# Patient Record
Sex: Male | Born: 1935 | Race: Black or African American | Hispanic: No | Smoking: Never smoker
Health system: Southern US, Community
[De-identification: ages and names within clinical notes are randomized; demographics above are authoritative.]

## PROBLEM LIST (undated history)

## (undated) DIAGNOSIS — I4892 Unspecified atrial flutter: Secondary | ICD-10-CM

## (undated) DIAGNOSIS — G8929 Other chronic pain: Secondary | ICD-10-CM

## (undated) DIAGNOSIS — M549 Dorsalgia, unspecified: Secondary | ICD-10-CM

## (undated) DIAGNOSIS — M1990 Unspecified osteoarthritis, unspecified site: Secondary | ICD-10-CM

## (undated) DIAGNOSIS — E7800 Pure hypercholesterolemia, unspecified: Secondary | ICD-10-CM

## (undated) DIAGNOSIS — R011 Cardiac murmur, unspecified: Secondary | ICD-10-CM

## (undated) DIAGNOSIS — I10 Essential (primary) hypertension: Secondary | ICD-10-CM

## (undated) HISTORY — PX: COLONOSCOPY: SHX174

---

## 2004-06-10 ENCOUNTER — Encounter: Admission: RE | Admit: 2004-06-10 | Discharge: 2004-06-10 | Payer: Self-pay | Admitting: Nephrology

## 2005-03-07 ENCOUNTER — Encounter (HOSPITAL_COMMUNITY): Admission: RE | Admit: 2005-03-07 | Discharge: 2005-06-05 | Payer: Self-pay | Admitting: Cardiology

## 2005-05-06 ENCOUNTER — Encounter: Admission: RE | Admit: 2005-05-06 | Discharge: 2005-05-06 | Payer: Self-pay | Admitting: Nephrology

## 2005-05-19 ENCOUNTER — Ambulatory Visit (HOSPITAL_COMMUNITY): Admission: RE | Admit: 2005-05-19 | Discharge: 2005-05-19 | Payer: Self-pay | Admitting: Nephrology

## 2005-07-12 ENCOUNTER — Inpatient Hospital Stay (HOSPITAL_BASED_OUTPATIENT_CLINIC_OR_DEPARTMENT_OTHER): Admission: RE | Admit: 2005-07-12 | Discharge: 2005-07-12 | Payer: Self-pay | Admitting: Cardiology

## 2005-10-05 ENCOUNTER — Emergency Department (HOSPITAL_COMMUNITY): Admission: EM | Admit: 2005-10-05 | Discharge: 2005-10-05 | Payer: Self-pay | Admitting: Emergency Medicine

## 2006-09-18 ENCOUNTER — Encounter: Admission: RE | Admit: 2006-09-18 | Discharge: 2006-09-18 | Payer: Self-pay | Admitting: Nephrology

## 2007-02-01 ENCOUNTER — Emergency Department (HOSPITAL_COMMUNITY): Admission: EM | Admit: 2007-02-01 | Discharge: 2007-02-01 | Payer: Self-pay | Admitting: Emergency Medicine

## 2008-09-05 ENCOUNTER — Emergency Department (HOSPITAL_COMMUNITY): Admission: EM | Admit: 2008-09-05 | Discharge: 2008-09-05 | Payer: Self-pay | Admitting: Emergency Medicine

## 2009-07-20 ENCOUNTER — Emergency Department (HOSPITAL_COMMUNITY): Admission: EM | Admit: 2009-07-20 | Discharge: 2009-07-20 | Payer: Self-pay | Admitting: Emergency Medicine

## 2009-10-06 ENCOUNTER — Encounter: Admission: RE | Admit: 2009-10-06 | Discharge: 2009-10-06 | Payer: Self-pay | Admitting: Nephrology

## 2010-04-23 ENCOUNTER — Emergency Department (HOSPITAL_COMMUNITY)
Admission: EM | Admit: 2010-04-23 | Discharge: 2010-04-23 | Disposition: A | Discharge status: Home / Self Care | Payer: Medicare Other | Attending: Emergency Medicine | Admitting: Emergency Medicine

## 2010-04-23 ENCOUNTER — Emergency Department (HOSPITAL_COMMUNITY): Payer: Medicare Other

## 2010-04-23 DIAGNOSIS — R071 Chest pain on breathing: Secondary | ICD-10-CM | POA: Insufficient documentation

## 2010-04-23 DIAGNOSIS — Z79899 Other long term (current) drug therapy: Secondary | ICD-10-CM | POA: Insufficient documentation

## 2010-04-23 DIAGNOSIS — M25519 Pain in unspecified shoulder: Secondary | ICD-10-CM | POA: Insufficient documentation

## 2010-04-23 DIAGNOSIS — I10 Essential (primary) hypertension: Secondary | ICD-10-CM | POA: Insufficient documentation

## 2010-04-23 DIAGNOSIS — Z862 Personal history of diseases of the blood and blood-forming organs and certain disorders involving the immune mechanism: Secondary | ICD-10-CM | POA: Insufficient documentation

## 2010-04-23 DIAGNOSIS — Z8639 Personal history of other endocrine, nutritional and metabolic disease: Secondary | ICD-10-CM | POA: Insufficient documentation

## 2010-07-14 ENCOUNTER — Emergency Department (HOSPITAL_COMMUNITY)
Admission: EM | Admit: 2010-07-14 | Discharge: 2010-07-14 | Disposition: A | Discharge status: Home / Self Care | Payer: Medicare Other | Attending: Emergency Medicine | Admitting: Emergency Medicine

## 2010-07-14 ENCOUNTER — Emergency Department (HOSPITAL_COMMUNITY): Payer: Medicare Other

## 2010-07-14 DIAGNOSIS — M5412 Radiculopathy, cervical region: Secondary | ICD-10-CM | POA: Insufficient documentation

## 2010-07-14 DIAGNOSIS — M109 Gout, unspecified: Secondary | ICD-10-CM | POA: Insufficient documentation

## 2010-07-14 DIAGNOSIS — I10 Essential (primary) hypertension: Secondary | ICD-10-CM | POA: Insufficient documentation

## 2010-07-14 DIAGNOSIS — M79609 Pain in unspecified limb: Secondary | ICD-10-CM | POA: Insufficient documentation

## 2010-07-14 DIAGNOSIS — M542 Cervicalgia: Secondary | ICD-10-CM | POA: Insufficient documentation

## 2010-08-06 NOTE — Cardiovascular Report (Signed)
NAME:  Jeffery White, ROUNDS NO.:  [id number]   MEDICAL RECORD NO.:  [id number]          PATIENT TYPE:  OIB   LOCATION:  1961                         FACILITY:  [facility]   PHYSICIAN:  Mohan N. Sharyn Lull, M.D. DATE OF BIRTH:  Jun 15, 1933   DATE OF PROCEDURE:  07/12/2005  DATE OF DISCHARGE:  07/12/2005                              CARDIAC CATHETERIZATION   PROCEDURE:  Left cardiac cath with selective left and right coronary  angiography, left ventriculography, via right groin using Judkins technique.   INDICATIONS FOR PROCEDURE:  Mr. Kerner is 77 year old black male with past  medical history significant for hypertension, glucose intolerance,  hypercholesteremia, morbid obesity, degenerative joint disease. He complains  of vague chest pain associated with the left arm pain off and on relieved  with sublingual nitro without associated symptoms of nausea, vomiting or  diaphoresis. The patient also gives a history of exertional dyspnea with  minimal exertion associated with feeling weak and tired. EKG done in my  office showed sinus bradycardia with first degree AV block, incomplete left  bundle branch block, and nonspecific ST-T wave changes. The patient denies  any palpitation, light-headedness or syncope. Denies PND, orthopnea, leg  swelling. Denies relation of chest pain to breathing, coughing, movement.  Denies any history of neck trauma in the past. Past medical history as  above. Past surgical history reveals he had laryngeal polyp resection in the  past, had dental surgery in the past, had right shoulder surgery in 1990.   ALLERGIES:  NO KNOWN DRUG ALLERGIES.   MEDICATIONS:  He is on atenolol 25 mg p.o. daily, Norvasc 5 mg p.o. daily,  Crestor 10 mg p.o. daily, Nitrostat 0.4 mg sublingual p.r.n., aspirin 81 mg  p.o. daily, hydrochlorothiazide 25 mg p.o. daily.   SOCIAL HISTORY:  He is married, has six children. No history of smoking.  He  used to drink socially but quit  1 1/2 years ago. Worked at [employer] and also as an Architect in the past.   FAMILY HISTORY:  Negative for coronary artery disease.   PHYSICAL EXAMINATION:  On examination, he is alert and oriented x3 in no  acute distress. Blood pressure was 130/70, pulse was regular at 58, sinus  brady on the monitor.  Conjunctivae was pink. Neck supple, no JVD, no bruit.  Lungs were clear to auscultation without rhonchi or rales. Abdomen was soft,  obese, nontender. Extremities reveals no clubbing, cyanosis or edema.   IMPRESSION:  Recurrent chest pain, left arm pain, rule out coronary  insufficiency, hypertension, glucose intolerance, hypercholesteremia, morbid  obesity, degenerative joint disease.   I discussed with the patient regarding left cath, its risks and benefits  i.e. death, MI, stroke, need for emergency CABG, local vascular  complications, etc., and he consented for the procedure.   PROCEDURE:  After obtaining informed consent, the patient was brought to the  cath lab and was placed on fluoroscopy table. The right groin was prepped  and draped in usual fashion. 2% Xylocaine was used for local anesthesia in  the right groin.  With the help of a  thin-wall needle, a 4-French arterial  sheath was placed. The sheath was aspirated and flushed. Next, 4-French left  Judkins catheter was advanced over the wire under fluoroscopic guidance to  the ascending aorta. The wire was pulled out, the catheter was aspirated and  connected to the manifold. The catheter was further advanced and engaged  into the left coronary ostium. Multiple views of the left system were taken.  Next, the catheter was disengaged and was pulled out over the wire and was  replaced with 4-French right Judkins catheter which was advanced over the  wire under fluoroscopic guidance into the ascending aorta. The wire was  pulled out, the catheter was aspirated and connected to the manifold. The  catheter was  further advanced and engaged into right coronary ostium.  Multiple views of the right system were taken. Next, the catheter was  disengaged and was pulled out over the wire and was replaced with 4-French  pigtail catheter which was advanced over the wire under fluoroscopic  guidance into the ascending aorta. The wire was pulled out, the catheter was  aspirated and connected to the manifold. The catheter was further advanced  across aortic valve into the LV. LV pressures were recorded. Next, LV graft  was done in 30 degrees RAO position. Post angiographic pressures were  recorded from LV and then pullback pressures were recorded from the aorta.  There was no gradient across aortic valve. Next, the pigtail catheter was  pulled out over the wire, the sheaths were aspirated and flushed.   FINDINGS:  LV showed good LV systolic function, EF of 55-60%.   Left main was long which was patent.   LAD has 5-10% proximal and mid stenosis. Diagonal one and two were small  which were patent.   Left circumflex was patent. High OM-1 was long which was small but was  patent. OM-2 was small which was patent.   RCA was patent. PDA was small which was patent.   The patient tolerated procedure well. There are no complications. The  patient was transferred to the recovery room in stable condition.           ______________________________  Eduardo Osier Sharyn Lull, M.D.     [facility]  D:  07/12/2005  T:  07/12/2005  Job:  [id number]   cc:   Jarome Matin, M.D.  Fax: [phone]

## 2011-03-16 ENCOUNTER — Emergency Department (HOSPITAL_COMMUNITY)
Admission: EM | Admit: 2011-03-16 | Discharge: 2011-03-16 | Disposition: A | Discharge status: Home / Self Care | Payer: Medicare Other | Attending: Emergency Medicine | Admitting: Emergency Medicine

## 2011-03-16 ENCOUNTER — Encounter: Payer: Self-pay | Admitting: *Deleted

## 2011-03-16 DIAGNOSIS — M25559 Pain in unspecified hip: Secondary | ICD-10-CM | POA: Insufficient documentation

## 2011-03-16 DIAGNOSIS — I10 Essential (primary) hypertension: Secondary | ICD-10-CM | POA: Insufficient documentation

## 2011-03-16 DIAGNOSIS — E7800 Pure hypercholesterolemia, unspecified: Secondary | ICD-10-CM | POA: Insufficient documentation

## 2011-03-16 DIAGNOSIS — M25552 Pain in left hip: Secondary | ICD-10-CM

## 2011-03-16 DIAGNOSIS — Z79899 Other long term (current) drug therapy: Secondary | ICD-10-CM | POA: Insufficient documentation

## 2011-03-16 HISTORY — DX: Essential (primary) hypertension: I10

## 2011-03-16 HISTORY — DX: Other chronic pain: G89.29

## 2011-03-16 HISTORY — DX: Pure hypercholesterolemia, unspecified: E78.00

## 2011-03-16 HISTORY — DX: Dorsalgia, unspecified: M54.9

## 2011-03-16 MED ORDER — OXYCODONE-ACETAMINOPHEN 5-325 MG PO TABS: 2.0000 | ORAL_TABLET | ORAL | Status: AC | PRN

## 2011-03-16 MED ORDER — HYDROMORPHONE HCL PF 2 MG/ML IJ SOLN
2.0000 mg | Freq: Once | INTRAMUSCULAR | Status: AC
  Administered 2011-03-16: 2 mg via INTRAMUSCULAR
  Filled 2011-03-16: qty 1

## 2011-03-16 NOTE — ED Notes (Signed)
Vital signs stable. 

## 2011-03-16 NOTE — ED Provider Notes (Signed)
History     CSN: [id number]  Arrival date & time 03/16/11  4098   First MD Initiated Contact with Patient 03/16/11 [phone]      Chief Complaint  Patient presents with  . Hip Pain    (Consider location/radiation/quality/duration/timing/severity/associated sxs/prior treatment) Patient is a 77 y.o. male presenting with hip pain. The history is provided by the patient.  Hip Pain Pertinent negatives include no chest pain, no abdominal pain and no headaches.   the patient is a 77 year old, male, who presents to the emergency department with one week of left hip pain.  He says he was ambulating and he twisted and developed sudden pain in his left hip, which has persisted for approximately a week.  He is falling.  He has not been in a car accident.  He is still ambulatory.  He denies weakness in his or extremity.  The pain in his anterior hip.  It does not radiate.  He has not noticed any swelling or masses.  Past Medical History  Diagnosis Date  . Hypertension   . Hypercholesteremia   . Chronic back pain   . Gout     History reviewed. No pertinent past surgical history.  No family history on file.  History  Substance Use Topics  . Smoking status: Never Smoker   . Smokeless tobacco: Not on file  . Alcohol Use: No      Review of Systems  Constitutional: Negative for fever and chills.  Respiratory: Negative for cough.   Cardiovascular: Negative for chest pain.  Gastrointestinal: Negative for abdominal pain.  Genitourinary: Negative for dysuria.  Musculoskeletal: Negative for myalgias, back pain, joint swelling and gait problem.  Skin: Negative for rash.  Neurological: Negative for headaches.  Hematological: Does not bruise/bleed easily.  Psychiatric/Behavioral: Negative for confusion.    Allergies  Review of patient's allergies indicates no known allergies.  Home Medications   Current Outpatient Rx  Name Route Sig Dispense Refill  . AMLODIPINE BESYLATE 5 MG PO TABS  Oral Take 5 mg by mouth daily.      . ASPIRIN 81 MG PO TABS Oral Take 81 mg by mouth daily.      . ATENOLOL 50 MG PO TABS Oral Take 50 mg by mouth daily.      . ETODOLAC 500 MG PO TABS Oral Take 500 mg by mouth 2 (two) times daily as needed.      . FENOFIBRATE 48 MG PO TABS Oral Take 48 mg by mouth daily.      Marland Kitchen HYDROCHLOROTHIAZIDE 25 MG PO TABS Oral Take 25 mg by mouth daily.      Marland Kitchen HYDROCODONE-ACETAMINOPHEN 7.5-325 MG PO TABS Oral Take 1 tablet by mouth every 6 (six) hours as needed. pain     . IBUPROFEN 800 MG PO TABS Oral Take 800 mg by mouth 2 (two) times daily after a meal. pain    . ROSUVASTATIN CALCIUM 10 MG PO TABS Oral Take 5 mg by mouth daily. Pt takes 1/2 tab     . GABAPENTIN 300 MG PO CAPS Oral Take 300 mg by mouth 3 (three) times daily.        BP 142/72  Pulse 77  Temp(Src) 97.8 F (36.6 C) (Oral)  Resp 16  Wt 290 lb (131.543 kg)  SpO2 96%  Physical Exam  Constitutional: He is oriented to person, place, and time.       Morbidly obese  HENT:  Head: Normocephalic and atraumatic.  Eyes: Pupils are equal, round,  and reactive to light.  Neck: Normal range of motion.  Abdominal: He exhibits no distension.  Musculoskeletal: Normal range of motion. He exhibits no edema.       Tenderness over left anterior superior iliac spine area.  No swelling, masses, or discoloration.  Lower extremity is normal with full range of motion and no weakness.  On external rotation of his femur.  He complains of mild increase in pain.  Neurological: He is alert and oriented to person, place, and time.  Skin: Skin is warm and dry.  Psychiatric: He has a normal mood and affect.    ED Course  Procedures (including critical care time) 77 year old male complains of nontraumatic left hip pain for approximately one week.  He is still ambulatory.  There is no deformities of his leg.  He has no weakness.  There are no indications for testing at this time.  Labs Reviewed - No data to display No  results found.   No diagnosis found.    MDM  Left hip pain Patient is still ambulatory.  There is no history of trauma.  There is no indication of a fracture, dislocation, or neurological disorder.        Nicholes Stairs, MD 03/16/11 [phone]

## 2011-03-16 NOTE — ED Notes (Signed)
Pt states "my left hip has been hurting x 1 wk, I think I made a wrong turn and it hurt me a little bit"

## 2011-03-16 NOTE — ED Notes (Signed)
Patient is resting comfortably. 

## 2011-03-16 NOTE — ED Notes (Signed)
MD at bedside. 

## 2011-03-22 ENCOUNTER — Other Ambulatory Visit: Payer: Self-pay | Admitting: Cardiology

## 2011-06-18 ENCOUNTER — Other Ambulatory Visit: Payer: Self-pay | Admitting: Cardiology

## 2011-08-15 ENCOUNTER — Other Ambulatory Visit: Payer: Self-pay | Admitting: Cardiology

## 2011-11-20 ENCOUNTER — Other Ambulatory Visit: Payer: Self-pay | Admitting: Nephrology

## 2015-08-13 ENCOUNTER — Encounter (HOSPITAL_COMMUNITY): Payer: Self-pay

## 2015-08-13 ENCOUNTER — Emergency Department (HOSPITAL_COMMUNITY)
Admission: EM | Admit: 2015-08-13 | Discharge: 2015-08-13 | Disposition: A | Discharge status: Home / Self Care | Payer: Medicare Other | Attending: Emergency Medicine | Admitting: Emergency Medicine

## 2015-08-13 DIAGNOSIS — G8929 Other chronic pain: Secondary | ICD-10-CM | POA: Diagnosis not present

## 2015-08-13 DIAGNOSIS — R269 Unspecified abnormalities of gait and mobility: Secondary | ICD-10-CM | POA: Diagnosis not present

## 2015-08-13 DIAGNOSIS — M79671 Pain in right foot: Secondary | ICD-10-CM | POA: Diagnosis present

## 2015-08-13 DIAGNOSIS — I10 Essential (primary) hypertension: Secondary | ICD-10-CM | POA: Diagnosis not present

## 2015-08-13 DIAGNOSIS — Z7982 Long term (current) use of aspirin: Secondary | ICD-10-CM | POA: Diagnosis not present

## 2015-08-13 DIAGNOSIS — M109 Gout, unspecified: Secondary | ICD-10-CM

## 2015-08-13 DIAGNOSIS — E7800 Pure hypercholesterolemia, unspecified: Secondary | ICD-10-CM | POA: Insufficient documentation

## 2015-08-13 DIAGNOSIS — M10071 Idiopathic gout, right ankle and foot: Secondary | ICD-10-CM | POA: Insufficient documentation

## 2015-08-13 DIAGNOSIS — Z791 Long term (current) use of non-steroidal anti-inflammatories (NSAID): Secondary | ICD-10-CM | POA: Diagnosis not present

## 2015-08-13 DIAGNOSIS — Z79899 Other long term (current) drug therapy: Secondary | ICD-10-CM | POA: Diagnosis not present

## 2015-08-13 MED ORDER — COLCHICINE 0.6 MG PO TABS: 0.6000 mg | ORAL_TABLET | Freq: Every day | ORAL | Status: DC

## 2015-08-13 MED ORDER — OXYCODONE-ACETAMINOPHEN 5-325 MG PO TABS
1.0000 | ORAL_TABLET | Freq: Once | ORAL | Status: AC
  Administered 2015-08-13: 1 via ORAL
  Filled 2015-08-13: qty 1

## 2015-08-13 MED ORDER — OXYCODONE-ACETAMINOPHEN 5-325 MG PO TABS: 1.0000 | ORAL_TABLET | Freq: Four times a day (QID) | ORAL | Status: DC | PRN

## 2015-08-13 NOTE — ED Notes (Signed)
Pt presents with 2 day h/o L foot and ankle redness and pain.  Pt denies any injury.

## 2015-08-13 NOTE — Discharge Instructions (Signed)

## 2015-08-13 NOTE — ED Provider Notes (Signed)
CSN: [id number]     Arrival date & time 08/13/15  [id number] History   First MD Initiated Contact with Patient 08/13/15 [phone]     Chief Complaint  Patient presents with  . Foot Pain      Patient is a 82 y.o. male presenting with lower extremity pain. The history is provided by the patient.  Foot Pain This is a new problem. Pertinent negatives include no chest pain and no shortness of breath.  Patient presents with pain in his right foot. Began a day or 2 ago. Some redness and swelling. States the pain and swelling has decreased somewhat. No systemic fevers or chills. No trauma. No chest pain or trouble breathing. He has not had pains like this before. No reported history of gout. No change in his urination pattern. No recent changes medications.  Past Medical History  Diagnosis Date  . Hypertension   . Hypercholesteremia   . Chronic back pain   . Gout    History reviewed. No pertinent past surgical history. No family history on file. Social History  Substance Use Topics  . Smoking status: Never Smoker   . Smokeless tobacco: None  . Alcohol Use: No    Review of Systems  Constitutional: Negative for fever.  Respiratory: Negative for shortness of breath.   Cardiovascular: Negative for chest pain.  Musculoskeletal: Positive for joint swelling and gait problem.  Skin: Positive for color change.  Neurological: Negative for weakness.      Allergies  Review of patient's allergies indicates no known allergies.  Home Medications   Prior to Admission medications   Medication Sig Start Date End Date Taking? Authorizing Provider  amLODipine (NORVASC) 5 MG tablet Take 5 mg by mouth daily.      Historical Provider, MD  aspirin 81 MG tablet Take 81 mg by mouth daily.      Historical Provider, MD  atenolol (TENORMIN) 50 MG tablet Take 50 mg by mouth daily.      Historical Provider, MD  colchicine 0.6 MG tablet Take 1 tablet (0.6 mg total) by mouth daily. 1.2 mg first day then 0.6 mg daily.  08/13/15   Benjiman CoreNathan Leatta Alewine, MD  etodolac (LODINE) 500 MG tablet Take 500 mg by mouth 2 (two) times daily as needed.      Historical Provider, MD  fenofibrate (TRICOR) 48 MG tablet Take 48 mg by mouth daily.      Historical Provider, MD  gabapentin (NEURONTIN) 300 MG capsule Take 300 mg by mouth 3 (three) times daily.      Historical Provider, MD  hydrochlorothiazide (HYDRODIURIL) 25 MG tablet Take 25 mg by mouth daily.      Historical Provider, MD  HYDROcodone-acetaminophen (NORCO) 7.5-325 MG per tablet Take 1 tablet by mouth every 6 (six) hours as needed. pain     Historical Provider, MD  ibuprofen (ADVIL,MOTRIN) 800 MG tablet Take 800 mg by mouth 2 (two) times daily after a meal. pain    Historical Provider, MD  oxyCODONE-acetaminophen (PERCOCET/ROXICET) 5-325 MG tablet Take 1-2 tablets by mouth every 6 (six) hours as needed for severe pain. 08/13/15   Benjiman CoreNathan Artia Singley, MD  rosuvastatin (CRESTOR) 10 MG tablet Take 5 mg by mouth daily. Pt takes 1/2 tab     Historical Provider, MD   BP 151/98 mmHg  Pulse 91  Temp(Src) 98.5 F (36.9 C) (Oral)  Resp 18  Ht 6' (1.829 m)  Wt 272 lb (123.378 kg)  BMI 36.88 kg/m2  SpO2 95% Physical Exam  Constitutional: He appears well-developed.  Musculoskeletal: He exhibits tenderness.  Erythema and tenderness over first MTP joint on the right foot. Neurovascular intact otherwise. Dorsalis pedis pulse intact. No tenderness or pain at ankle.  Neurological: He is alert.  Skin: Skin is warm. There is erythema.    ED Course  Procedures (including critical care time) Labs Review Labs Reviewed - No data to display  Imaging Review No results found. I have personally reviewed and evaluated these images and lab results as part of my medical decision-making.   EKG Interpretation None      MDM   Final diagnoses:  Acute gout of right foot, unspecified cause    Patient with likely gout in right first MTP joint. Will treat with colchicine and some pain  medicine. Will follow-up with primary care doctor.    Benjiman Core, MD 08/13/15 [phone]

## 2016-03-13 ENCOUNTER — Other Ambulatory Visit (INDEPENDENT_AMBULATORY_CARE_PROVIDER_SITE_OTHER): Payer: Self-pay | Admitting: Specialist

## 2016-03-17 NOTE — Telephone Encounter (Signed)
[facility] calling about RX refill they sent E script. They faxed it also to the new fax number incase we haven't received it

## 2016-03-23 ENCOUNTER — Ambulatory Visit (INDEPENDENT_AMBULATORY_CARE_PROVIDER_SITE_OTHER): Payer: Self-pay | Admitting: Specialist

## 2016-07-04 ENCOUNTER — Ambulatory Visit (INDEPENDENT_AMBULATORY_CARE_PROVIDER_SITE_OTHER): Payer: Medicare Other | Admitting: Specialist

## 2016-07-04 ENCOUNTER — Ambulatory Visit (INDEPENDENT_AMBULATORY_CARE_PROVIDER_SITE_OTHER): Payer: Medicare Other

## 2016-07-04 ENCOUNTER — Encounter (INDEPENDENT_AMBULATORY_CARE_PROVIDER_SITE_OTHER): Payer: Self-pay | Admitting: Specialist

## 2016-07-04 VITALS — BP 145/89 | HR 78 | Ht 72.0 in | Wt 267.0 lb

## 2016-07-04 DIAGNOSIS — M25551 Pain in right hip: Secondary | ICD-10-CM | POA: Diagnosis not present

## 2016-07-04 DIAGNOSIS — M160 Bilateral primary osteoarthritis of hip: Secondary | ICD-10-CM | POA: Diagnosis not present

## 2016-07-04 MED ORDER — MELOXICAM 15 MG PO TABS: 15.0000 mg | ORAL_TABLET | Freq: Every day | ORAL | 6 refills | Status: DC

## 2016-07-04 MED ORDER — OXYCODONE-ACETAMINOPHEN 5-325 MG PO TABS
1.0000 | ORAL_TABLET | ORAL | 0 refills | Status: DC | PRN
Start: 2016-07-04 — End: 2016-08-16

## 2016-07-04 NOTE — Patient Instructions (Addendum)
  Hips are suffering from osteoarthritis, only real proven treatments are Weight loss, NSIADs like diclofenac and exercise. Well padded shoes help. Ice the hips and knees 2-3 times a day 15-20 mins at a time. Referral to pain management with Dr. Hermelinda Medicus with [facility] For continued medication. Change to Meloxicam 15 mg po q Day. Stop the Ibuprofen.

## 2016-07-04 NOTE — Progress Notes (Signed)
Office Visit Note   Patient: Jeffery White           Date of Birth: Jan 16, 1934           MRN: [id number] Visit Date: 07/04/2016              Requested by: No referring provider defined for this encounter. PCP: Georgann Housekeeper, MD   Assessment & Plan: Visit Diagnoses:  1. Pain of right hip joint   2. Primary osteoarthritis of both hips     Plan: Hips are suffering from osteoarthritis, only real proven treatments are Weight loss, NSIADs like diclofenac and exercise. Well padded shoes help. Ice the hips and knees 2-3 times a day 15-20 mins at a time. Referral to pain management with Dr. Hermelinda Medicus with [facility] for continued medication. Change to Meloxicam 15 mg po q Day. Stop the Ibuprofen  Follow-Up Instructions: Return in about 3 months (around 10/03/2016).   Orders:  Orders Placed This Encounter  Procedures  . XR HIP UNILAT W OR W/O PELVIS 2-3 VIEWS RIGHT   No orders of the defined types were placed in this encounter.     Procedures: No procedures performed   Clinical Data: No additional findings.   Subjective: Chief Complaint  Patient presents with  . Right Hip - Pain    83 year old male, has been followed for osteoarthritis of the spine and hips. Has taken cholcicine for gout in the feet and ibuprofen. He is taking citric phosphate, avoiding red meats. Has arthritis in most of his large joints. Has lost weight 40-50 lbs Is using a cane now for the last 2 weeks. Still fishing porgy and sea bass.    Review of Systems  Constitutional: Negative.   HENT: Negative.   Eyes: Negative.   Respiratory: Negative.   Cardiovascular: Negative.   Gastrointestinal: Negative.   Endocrine: Negative.   Genitourinary: Negative.   Musculoskeletal: Negative.   Skin: Negative.   Allergic/Immunologic: Negative.   Neurological: Negative.   Hematological: Negative.   Psychiatric/Behavioral: Negative.      Objective: Vital Signs: BP (!) 145/89 (BP  Location: Left Arm, Patient Position: Sitting)   Pulse 78   Ht 6' (1.829 m)   Wt 267 lb (121.1 kg)   BMI 36.21 kg/m   Physical Exam  Constitutional: He is oriented to person, place, and time. He appears well-developed and well-nourished.  HENT:  Head: Normocephalic and atraumatic.  Eyes: EOM are normal. Pupils are equal, round, and reactive to light.  Neck: Normal range of motion. Neck supple.  Pulmonary/Chest: Effort normal and breath sounds normal.  Abdominal: Soft. Bowel sounds are normal.  Neurological: He is alert and oriented to person, place, and time.  Skin: Skin is warm and dry.  Psychiatric: He has a normal mood and affect. His behavior is normal. Judgment and thought content normal.    Right Hip Exam   Tenderness  The patient is experiencing tenderness in the greater trochanter and anterior.  Range of Motion  Extension: abnormal  Flexion: abnormal  Internal Rotation:  0 abnormal  External Rotation:  20 abnormal  Abduction: abnormal  Adduction: abnormal   Muscle Strength  Abduction: 5/5  Adduction: 5/5  Flexion: 5/5   Tests  FABER: positive Ober: positive  Other  Erythema: absent Scars: absent   Left Hip Exam   Tenderness  The patient is experiencing tenderness in the anterior and greater trochanter.  Range of Motion  Extension: abnormal  Flexion: abnormal  Internal Rotation: 25 abnormal  External Rotation:  30 abnormal  Abduction: abnormal  Adduction: abnormal   Muscle Strength  Abduction: 5/5  Adduction: 5/5  Flexion: 5/5   Tests  FABER: negative Ober: negative  Other  Erythema: absent Scars: absent   Back Exam   Tenderness  The patient is experiencing tenderness in the lumbar and cervical.  Range of Motion  Extension: normal  Flexion: normal  Lateral Bend Right: normal  Lateral Bend Left: normal  Rotation Right: normal  Rotation Left: normal   Muscle Strength  Right Quadriceps:  5/5  Left Quadriceps:  5/5  Right  Hamstrings:  5/5  Left Hamstrings:  5/5   Tests  Straight leg raise right: negative Straight leg raise left: negative  Reflexes  Patellar: normal Achilles: normal Biceps: normal Babinski's sign: normal   Other  Toe Walk: normal Heel Walk: normal Sensation: normal Gait: normal  Scars: absent      Specialty Comments:  No specialty comments available.  Imaging: Xr Hip Unilat W Or W/o Pelvis 2-3 Views Right  Result Date: 07/04/2016 AP and lateral of the right hip shows narrowing of the right hip joint concentricly wih bone on bone appearance over the superiolateral Aspect of the right acetabulum. Osteophytes are present inferomdial and over the lateral head and neck.     PMFS History: There are no active problems to display for this patient.  Past Medical History:  Diagnosis Date  . Chronic back pain   . Gout   . Hypercholesteremia   . Hypertension     No family history on file.  No past surgical history on file. Social History   Occupational History  . Not on file.   Social History Main Topics  . Smoking status: Never Smoker  . Smokeless tobacco: Never Used  . Alcohol use No  . Drug use: No  . Sexual activity: Not on file

## 2016-08-12 ENCOUNTER — Ambulatory Visit (INDEPENDENT_AMBULATORY_CARE_PROVIDER_SITE_OTHER): Payer: Medicare Other | Admitting: Specialist

## 2016-08-16 ENCOUNTER — Ambulatory Visit (INDEPENDENT_AMBULATORY_CARE_PROVIDER_SITE_OTHER): Payer: Medicare Other | Admitting: Specialist

## 2016-08-16 ENCOUNTER — Encounter (INDEPENDENT_AMBULATORY_CARE_PROVIDER_SITE_OTHER): Payer: Self-pay | Admitting: Specialist

## 2016-08-16 VITALS — BP 148/95 | HR 69 | Ht 72.0 in | Wt 267.0 lb

## 2016-08-16 DIAGNOSIS — M47812 Spondylosis without myelopathy or radiculopathy, cervical region: Secondary | ICD-10-CM

## 2016-08-16 DIAGNOSIS — M25551 Pain in right hip: Secondary | ICD-10-CM

## 2016-08-16 DIAGNOSIS — M160 Bilateral primary osteoarthritis of hip: Secondary | ICD-10-CM

## 2016-08-16 DIAGNOSIS — M170 Bilateral primary osteoarthritis of knee: Secondary | ICD-10-CM

## 2016-08-16 MED ORDER — MELOXICAM 15 MG PO TABS: 15.0000 mg | ORAL_TABLET | Freq: Every day | ORAL | 6 refills | Status: DC

## 2016-08-16 MED ORDER — OXYCODONE-ACETAMINOPHEN 5-325 MG PO TABS: 1.0000 | ORAL_TABLET | ORAL | 0 refills | Status: DC | PRN

## 2016-08-16 NOTE — Patient Instructions (Signed)
The main ways of treat osteoarthritis, that are found to be success. Weight loss helps to decrease pain. Exercise is important to maintaining cartilage and thickness and strengthening. NSAIDs like motrin, tylenol, alleve are meds decreasing the inflamation. Ice is okay  In afternoon and evening and hot shower in the am Dr. Avonia BlasNewton's secretary will call you to schedule an injection of the right hip.

## 2016-08-16 NOTE — Progress Notes (Signed)
Office Visit Note   Patient: Jeffery White           Date of Birth: Jun 01, 1933           MRN: [id number] Visit Date: 08/16/2016              Requested by: Georgann Housekeeper, MD [address] [place], [place] [address] PCP: Georgann Housekeeper, MD   Assessment & Plan: Visit Diagnoses:  1. Pain of right hip joint   2. Spondylosis without myelopathy or radiculopathy, cervical region   3. Bilateral primary osteoarthritis of knee   4. Primary osteoarthritis of both hips     Plan:  The main ways of treat osteoarthritis, that are found to be success. Weight loss helps to decrease pain. Exercise is important to maintaining cartilage and thickness and strengthening. NSAIDs like motrin, tylenol, alleve are meds decreasing the inflamation. Ice is okay  In afternoon and evening and hot shower in the am Dr. [place] Blas secretary will call you to schedule an injection of the right hip. Follow-Up Instructions: Return in about 6 months (around 02/16/2017).   Orders:  Orders Placed This Encounter  Procedures  . Ambulatory referral to Physical Medicine Rehab   Meds ordered this encounter  Medications  . oxyCODONE-acetaminophen (PERCOCET/ROXICET) 5-325 MG tablet    Sig: Take 1-2 tablets by mouth every 4 (four) hours as needed for severe pain.    Dispense:  50 tablet    Refill:  0  . meloxicam (MOBIC) 15 MG tablet    Sig: Take 1 tablet (15 mg total) by mouth daily.    Dispense:  30 tablet    Refill:  6      Procedures: No procedures performed   Clinical Data: No additional findings.   Subjective: Chief Complaint  Patient presents with  . Right Hip - Follow-up, Pain    83 year old male with right hip pain persisting, pain is nearly constant. Present when he first stands and walk. Limbers up after a few steps. He has had previous intraarticular hip steroid  Injection with good improvement in hip pain. That injection was nearly one year ago 06/2015. He is sleeping without  difficulty. He uses a cane for ambulation. Takes mobic for pain daily. He also takes hydrocodone intermittantly when he is severly stiff and the mobic.    Review of Systems  Constitutional: Negative.   HENT: Positive for rhinorrhea.   Eyes: Negative.   Respiratory: Positive for cough.   Cardiovascular: Negative.   Gastrointestinal: Negative.   Endocrine: Negative.   Genitourinary: Negative.   Musculoskeletal: Positive for arthralgias, back pain and joint swelling. Negative for neck stiffness.  Skin: Negative.   Allergic/Immunologic: Negative.   Neurological: Positive for numbness.  Hematological: Negative.   Psychiatric/Behavioral: Negative.      Objective: Vital Signs: BP (!) 148/95 (BP Location: Left Arm, Patient Position: Sitting)   Pulse 69   Ht 6' (1.829 m)   Wt 267 lb (121.1 kg)   BMI 36.21 kg/m   Physical Exam  Constitutional: He is oriented to person, place, and time. He appears well-developed and well-nourished.  HENT:  Head: Normocephalic and atraumatic.  Eyes: EOM are normal. Pupils are equal, round, and reactive to light.  Neck: Normal range of motion. Neck supple.  Pulmonary/Chest: Effort normal and breath sounds normal.  Abdominal: Soft. Bowel sounds are normal.  Neurological: He is alert and oriented to person, place, and time.  Skin: Skin is warm and dry.  Psychiatric: He has a normal mood and affect. His behavior is normal. Judgment and thought content normal.    Right Hip Exam   Tenderness  The patient is experiencing tenderness in the greater trochanter and anterior.  Range of Motion  Extension: normal  Flexion: normal  Internal Rotation:  0 abnormal  External Rotation:  30 abnormal  Abduction: abnormal  Adduction: abnormal   Muscle Strength  Abduction: 5/5  Adduction: 5/5  Flexion: 5/5   Tests  FABER: positive Ober: positive  Other  Erythema: absent Scars: absent Sensation: normal Pulse: present   Left Hip Exam  Left hip  exam is normal.   Back Exam   Tenderness  The patient is experiencing tenderness in the lumbar.  Range of Motion  Extension: normal  Flexion: normal  Lateral Bend Left: normal  Rotation Left: normal   Tests  Straight leg raise right: negative Straight leg raise left: negative  Reflexes  Patellar:  Hyporeflexic abnormal Achilles: Hyporeflexic Biceps: Hyporeflexic Babinski's sign: normal   Other  Toe Walk: normal Heel Walk: normal Sensation: normal Gait: Trendelenburg  Erythema: no back redness Scars: absent      Specialty Comments:  No specialty comments available.  Imaging: No results found.   PMFS History: There are no active problems to display for this patient.  Past Medical History:  Diagnosis Date  . Chronic back pain   . Gout   . Hypercholesteremia   . Hypertension     No family history on file.  No past surgical history on file. Social History   Occupational History  . Not on file.   Social History Main Topics  . Smoking status: Never Smoker  . Smokeless tobacco: Never Used  . Alcohol use No  . Drug use: No  . Sexual activity: Not on file

## 2016-08-24 ENCOUNTER — Ambulatory Visit (INDEPENDENT_AMBULATORY_CARE_PROVIDER_SITE_OTHER): Payer: Medicare Other | Admitting: Physical Medicine and Rehabilitation

## 2016-08-24 ENCOUNTER — Encounter (INDEPENDENT_AMBULATORY_CARE_PROVIDER_SITE_OTHER): Payer: Self-pay | Admitting: Physical Medicine and Rehabilitation

## 2016-08-24 ENCOUNTER — Ambulatory Visit (INDEPENDENT_AMBULATORY_CARE_PROVIDER_SITE_OTHER): Payer: Medicare Other

## 2016-08-24 VITALS — BP 150/93 | HR 70 | Temp 98.4°F

## 2016-08-24 DIAGNOSIS — M25551 Pain in right hip: Secondary | ICD-10-CM | POA: Diagnosis not present

## 2016-08-24 MED ORDER — BUPIVACAINE HCL 0.5 % IJ SOLN
3.0000 mL | INTRAMUSCULAR | Status: AC | PRN
  Administered 2016-08-24: 3 mL via INTRA_ARTICULAR

## 2016-08-24 MED ORDER — TRIAMCINOLONE ACETONIDE 40 MG/ML IJ SUSP
80.0000 mg | INTRAMUSCULAR | Status: AC | PRN
  Administered 2016-08-24: 80 mg via INTRA_ARTICULAR

## 2016-08-24 NOTE — Progress Notes (Signed)
   Jeffery White - 83 y.o. male MRN [id number]  Date of birth: 08-13-1933  Office Visit Note: Visit Date: 08/24/2016 PCP: Georgann HousekeeperHusain, Karrar, MD Referred by: Georgann HousekeeperHusain, Karrar, MD  Subjective: Chief Complaint  Patient presents with  . Right Hip - Pain   HPI: Jeffery White is an 83 year old gentleman with significant right hip osteoarthritis. Prior hip injection performed in our office gave him almost 1 years relief. Dr. Otelia SergeantNitka requests repeat right hip injection with fluoroscopic guidance. He is having return of right hip and groin pain. Worse in the morning more medication and activity. Worse with ambulating. He does use a cane.    ROS Otherwise per HPI.  Assessment & Plan: Visit Diagnoses:  1. Pain in right hip     Plan: Findings:  Right hip intra-articular injection with fluoroscopic guidance. Patient did have relief during the anesthetic phase.    Meds & Orders: No orders of the defined types were placed in this encounter.   Orders Placed This Encounter  Procedures  . Large Joint Injection/Arthrocentesis  . XR C-ARM NO REPORT    Follow-up: Return if symptoms worsen or fail to improve, for Dr. Otelia SergeantNitka.   Procedures: Large Joint Inj Date/Time: 08/24/2016 9:57 AM Performed by: Tyrell AntonioNEWTON, Krisanne Lich Authorized by: Tyrell AntonioNEWTON, Kazue Cerro   Consent Given by:  Patient Site marked: the procedure site was marked   Timeout: prior to procedure the correct patient, procedure, and site was verified   Indications:  Pain and diagnostic evaluation Location:  Hip Site:  R hip joint Prep: patient was prepped and draped in usual sterile fashion   Needle Size:  22 G Needle Length:  3.5 inches Approach:  Anterior Ultrasound Guidance: No   Fluoroscopic Guidance: Yes   Arthrogram: No   Medications:  80 mg triamcinolone acetonide 40 MG/ML; 3 mL bupivacaine 0.5 % Aspiration Attempted: Yes   Patient tolerance:  Patient tolerated the procedure well with no immediate complications  There was excellent flow of  contrast producing a partial arthrogram of the hip. The patient did have relief of symptoms during the anesthetic phase of the injection.    No notes on file   Clinical History: No specialty comments available.  He reports that he has never smoked. He has never used smokeless tobacco. No results for input(s): HGBA1C, LABURIC in the last 8760 hours.  Objective:  VS:  HT:    WT:   BMI:     BP:(!) 150/93  HR:70bpm  TEMP:98.4 F (36.9 C)( )  RESP:99 % Physical Exam  Musculoskeletal:  Patient has concordant pain with internal rotation. He ambulates with a cane. He has good distal strength.    Ortho Exam Imaging: Xr C-arm No Report  Result Date: 08/24/2016 Please see Notes or Procedures tab for imaging impression.   Past Medical/Family/Surgical/Social History: Medications & Allergies reviewed per EMR There are no active problems to display for this patient.  Past Medical History:  Diagnosis Date  . Chronic back pain   . Gout   . Hypercholesteremia   . Hypertension    No family history on file. No past surgical history on file. Social History   Occupational History  . Not on file.   Social History Main Topics  . Smoking status: Never Smoker  . Smokeless tobacco: Never Used  . Alcohol use No  . Drug use: No  . Sexual activity: Not on file

## 2016-08-24 NOTE — Patient Instructions (Signed)

## 2016-08-24 NOTE — Progress Notes (Deleted)
Right hip pain with radiating pain to groin. Pain is worse in the morning until after moving and taking medication.  Patient uses cane for walking. Has had previous injection before about 1 year with relief. Any increased activity such as walking, bending, or lifting will increase pain.

## 2017-02-16 ENCOUNTER — Encounter (INDEPENDENT_AMBULATORY_CARE_PROVIDER_SITE_OTHER): Payer: Self-pay | Admitting: Specialist

## 2017-02-16 ENCOUNTER — Ambulatory Visit (INDEPENDENT_AMBULATORY_CARE_PROVIDER_SITE_OTHER): Payer: Medicare Other | Admitting: Specialist

## 2017-02-16 VITALS — BP 137/83 | HR 74 | Ht 72.0 in | Wt 267.0 lb

## 2017-02-16 DIAGNOSIS — M25551 Pain in right hip: Secondary | ICD-10-CM | POA: Diagnosis not present

## 2017-02-16 DIAGNOSIS — M1611 Unilateral primary osteoarthritis, right hip: Secondary | ICD-10-CM | POA: Diagnosis not present

## 2017-02-16 DIAGNOSIS — M160 Bilateral primary osteoarthritis of hip: Secondary | ICD-10-CM

## 2017-02-16 DIAGNOSIS — M47816 Spondylosis without myelopathy or radiculopathy, lumbar region: Secondary | ICD-10-CM | POA: Diagnosis not present

## 2017-02-16 DIAGNOSIS — M47812 Spondylosis without myelopathy or radiculopathy, cervical region: Secondary | ICD-10-CM | POA: Diagnosis not present

## 2017-02-16 MED ORDER — OXYCODONE-ACETAMINOPHEN 5-325 MG PO TABS: 1.0000 | ORAL_TABLET | ORAL | 0 refills | Status: DC | PRN

## 2017-02-16 NOTE — Patient Instructions (Addendum)
  Knee is suffering from osteoarthritis, only real proven treatments are Weight loss, NSIADs like motrin and exercise. Well padded shoes help. Ice the hip 2-3 times a day 15-20 mins at a time. Schedule an appointment to see Dr. Allie Bossierhris Blackman to consider a right total hip replacement.

## 2017-02-16 NOTE — Progress Notes (Signed)
Office Visit Note   Patient: Jeffery White           Date of Birth: September 06, 1933           MRN: [id number] Visit Date: 02/16/2017              Requested by: Georgann HousekeeperHusain, Karrar, MD [address] [place], [place] [address] PCP: Georgann HousekeeperHusain, Karrar, MD   Assessment & Plan: Visit Diagnoses:  1. Unilateral primary osteoarthritis, right hip   2. Spondylosis without myelopathy or radiculopathy, cervical region   3. Spondylosis without myelopathy or radiculopathy, lumbar region     Plan: Knee is suffering from osteoarthritis, only real proven treatments are Weight loss, NSIADs like motrin and exercise. Well padded shoes help. Ice the hip 2-3 times a day 15-20 mins at a time. Schedule an appointment to see Dr. Allie Bossierhris Blackman to consider a right total hip replacement.   Follow-Up Instructions: Return for Please schedule this patient for appt with Dr. Magnus IvanBlackman in 2 weeks for eval for hip replacement..   Orders:  No orders of the defined types were placed in this encounter.  No orders of the defined types were placed in this encounter.     Procedures: No procedures performed   Clinical Data: Findings:  Plain radiographs 06/2016 with moderately severe degenerative changes of the right hip with medial and superior joint line narrowing. Superolateral right acetabulum osteophytes with osteophytes both superolateral and inferomedial femoral head. Subchondral sclerosis right acetabulum.    Subjective: Chief Complaint  Patient presents with  . Right Hip - Pain    83 year old male, extremely independent, fishing on most weekends, he has been experiencing right anterior groin pain with radiation into the right anterior thigh. He reports that over the last  6 months he is having to stop fishing and hunting due to decrease walking tolerance. He has pain with initiating walking and weight bearing on the right leg at the hip. He take narcotics to relieve His arthritis pain as the NSAIDs  do not really help. He is limited in his standing and walking to 10 minutes anid ambulation is with assistance, he is using a cane. No numbness or paresthesias on the right side. He does occasionally have left arm and hand nocturnal paresthesias that improve with changing position of the left arm. He wants to decrease his pain and improve his ability to be  Active again, he wants to fish and hunt. No bowel or bladder difficulties.     Review of Systems  Constitutional: Negative.   HENT: Positive for congestion, hearing loss, rhinorrhea and sinus pain. Negative for sinus pressure, tinnitus, trouble swallowing and voice change.   Eyes: Negative for pain, discharge, redness, itching and visual disturbance.  Respiratory: Positive for cough. Negative for apnea, choking, chest tightness, shortness of breath, wheezing and stridor.   Cardiovascular: Positive for leg swelling. Negative for chest pain and palpitations.  Gastrointestinal: Negative.  Negative for abdominal distention, abdominal pain, anal bleeding, blood in stool, constipation, diarrhea and nausea.  Endocrine: Negative.  Negative for cold intolerance, heat intolerance, polydipsia, polyphagia and polyuria.  Genitourinary: Positive for frequency. Negative for difficulty urinating, dysuria, enuresis, flank pain and hematuria.  Musculoskeletal: Positive for arthralgias, back pain, gait problem and joint swelling. Negative for myalgias, neck pain and neck stiffness.  Skin: Negative for color change, pallor, rash and wound.  Allergic/Immunologic: Negative.  Negative for environmental allergies, food allergies and immunocompromised state.  Neurological: Negative for dizziness, tremors, seizures,  syncope, facial asymmetry, speech difficulty, weakness, light-headedness, numbness and headaches.  Hematological: Negative.  Negative for adenopathy. Does not bruise/bleed easily.  Psychiatric/Behavioral: Negative.  Negative for agitation, behavioral  problems, confusion, decreased concentration, dysphoric mood, hallucinations, self-injury, sleep disturbance and suicidal ideas. The patient is not nervous/anxious and is not hyperactive.      Objective: Vital Signs: BP 137/83 (BP Location: Left Arm, Patient Position: Sitting)   Pulse 74   Ht 6' (1.829 m)   Wt 267 lb (121.1 kg)   BMI 36.21 kg/m   Physical Exam  Constitutional: He is oriented to person, place, and time. He appears well-developed and well-nourished. No distress.  HENT:  Head: Normocephalic and atraumatic.  Eyes: EOM are normal. Pupils are equal, round, and reactive to light. Right eye exhibits no discharge. Left eye exhibits no discharge.  Neck: Normal range of motion. Neck supple.  Pulmonary/Chest: Effort normal and breath sounds normal. No respiratory distress. He has no wheezes. He exhibits no tenderness.  Abdominal: Soft. Bowel sounds are normal. He exhibits no distension and no mass. There is no tenderness. There is no guarding.  Musculoskeletal: He exhibits no edema, tenderness or deformity.  Neurological: He is alert and oriented to person, place, and time.  Skin: Skin is warm and dry. He is not diaphoretic.  Psychiatric: He has a normal mood and affect. His behavior is normal. Judgment and thought content normal.    Right Hip Exam   Tenderness  The patient is experiencing tenderness in the anterior and greater trochanter.  Range of Motion  Abduction: abnormal  Adduction: abnormal  Extension: abnormal  Flexion:  90 abnormal  External rotation: 20  Internal rotation: 15   Muscle Strength  Abduction: 5/5  Adduction: 5/5  Flexion: 4/5   Tests  FABER: positive Ober: positive  Other  Erythema: absent Scars: absent Sensation: normal Pulse: present   Left Hip Exam  Left hip exam is normal.  Range of Motion  Abduction: normal  Adduction: normal  Extension: normal  Flexion: normal  External rotation: normal  Internal rotation: normal    Muscle Strength  Abduction: 5/5  Adduction: 5/5  Flexion: 5/5   Tests  FABER: negative Ober: negative  Other  Erythema: absent Scars: absent Sensation: normal Pulse: present      Specialty Comments:  No specialty comments available.  Imaging: No results found.   PMFS History: There are no active problems to display for this patient.  Past Medical History:  Diagnosis Date  . Chronic back pain   . Gout   . Hypercholesteremia   . Hypertension     History reviewed. No pertinent family history.  History reviewed. No pertinent surgical history. Social History   Occupational History  . Not on file  Tobacco Use  . Smoking status: Never Smoker  . Smokeless tobacco: Never Used  Substance and Sexual Activity  . Alcohol use: No  . Drug use: No  . Sexual activity: Not on file

## 2017-03-06 ENCOUNTER — Encounter (INDEPENDENT_AMBULATORY_CARE_PROVIDER_SITE_OTHER): Payer: Self-pay | Admitting: Orthopaedic Surgery

## 2017-03-06 ENCOUNTER — Ambulatory Visit (INDEPENDENT_AMBULATORY_CARE_PROVIDER_SITE_OTHER): Payer: Medicare Other | Admitting: Orthopaedic Surgery

## 2017-03-06 DIAGNOSIS — M1611 Unilateral primary osteoarthritis, right hip: Secondary | ICD-10-CM

## 2017-03-06 NOTE — Progress Notes (Signed)
Office Visit Note   Patient: Jeffery White           Date of Birth: Aug 08, 1933           MRN: [id number] Visit Date: 03/06/2017              Requested by: Georgann HousekeeperHusain, Karrar, MD [address] [place], [place] [address] PCP: Georgann HousekeeperHusain, Karrar, MD   Assessment & Plan: Visit Diagnoses:  1. Unilateral primary osteoarthritis, right hip     Plan: We went over a hip model and talked in detail about what hip replacement surgery involves.  We went over his x-rays as well.  We had a long and thorough discussion about the risk and benefits of the surgery.  He is tried and failed all forms of conservative treatment including failing 3 injections every years now.  His pain is daily and he feels that it is time to proceed with the surgery.  All questions concerns were answered and addressed.  We will see about setting the surgery sometime after the first the year.   Follow-Up Instructions: Return for 2 weeks post-op.   Orders:  No orders of the defined types were placed in this encounter.  No orders of the defined types were placed in this encounter.     Procedures: No procedures performed   Clinical Data: No additional findings.   Subjective: Chief Complaint  Patient presents with  . Right Hip - Pain  The patient is someone I am seeing for the first time however he is been seen by 1 of my partners for a long period of time.  I am seeing him as a referral to consider right hip replacement surgery.  He has had 3 intra-articular injections in his right hip and he did help before now it will help.  He has known severe end-stage arthritis of that hip.  His pain is daily and it is 10 out of 10.  He walks with a cane.  He is tried activity modification, anti-inflammatories, weight loss, hip strengthening exercises and multiple injections.  At this point he does wish to proceed with a total hip arthroplasty.  This treatment is been going on for over a year now.  His pain is been for  multiple years.  HPI  Review of Systems He currently denies any headache, chest pain, shortness of breath, fever, chills, nausea, vomiting.  Objective: Vital Signs: There were no vitals taken for this visit.  Physical Exam He is alert and oriented x3 and in no acute distress Ortho Exam Examination of his left hip is normal examination his right hip shows severe pain with internal and external rotation as well as flexion extension.  His leg lengths are near equal. Specialty Comments:  No specialty comments available.  Imaging: No results found. X-rays on the canopy system independently reviewed of his pelvis and right hip show severe end-stage arthritis of the right hip.  There is joint space narrowing as well as para-articular osteophytes.  There are sclerotic and cystic changes as well.  PMFS History: Patient Active Problem List   Diagnosis Date Noted  . Unilateral primary osteoarthritis, right hip 03/06/2017   Past Medical History:  Diagnosis Date  . Chronic back pain   . Gout   . Hypercholesteremia   . Hypertension     No family history on file.  No past surgical history on file. Social History   Occupational History  . Not on file  Tobacco Use  .  Smoking status: Never Smoker  . Smokeless tobacco: Never Used  Substance and Sexual Activity  . Alcohol use: No  . Drug use: No  . Sexual activity: Not on file

## 2017-03-15 ENCOUNTER — Telehealth (INDEPENDENT_AMBULATORY_CARE_PROVIDER_SITE_OTHER): Payer: Self-pay | Admitting: Specialist

## 2017-03-15 NOTE — Telephone Encounter (Signed)
Pt called and he would like to speak to Dr.Nitka the patient stated his medicine is not working and would like to try something different.   [phone]

## 2017-03-15 NOTE — Telephone Encounter (Signed)
I called patient. He states that he is out of the Oxycodone 5/325 and needs a refill. It does not work as well as the Oxycodone 7.5/325 that he was getting from [facility] prior to the insurance problem that now prohibits him from being treated there. He also states that he received #120 from them and only #50 from us. I explained that we do not treat chronic pain here and that he would not get Rx for that amount on a monthly basis from Dr. Otelia SergeantNitka. He is waiting to hear from surgery scheduler to be scheduled for [facility] with Dr. Magnus IvanBlackman after the first of the year.    Patient would like refill on Oxycodone. He understands that Dr. Otelia SergeantNitka is not in the office today and that he will be called once message is addressed.

## 2017-03-23 NOTE — Telephone Encounter (Signed)
See below, surgery?

## 2017-03-23 NOTE — Telephone Encounter (Signed)
See below, please advise.

## 2017-03-23 NOTE — Telephone Encounter (Signed)
I will not prescribe a stronger narcotic, I am not pain management, his pain is not related to surgery.

## 2017-03-23 NOTE — Telephone Encounter (Signed)
Find out from [place] about his surgery being scheduled.  I don't put any of my patients on narcotics for arthritis or pre-op before surgery.

## 2017-03-23 NOTE — Telephone Encounter (Signed)
I called and advised patient that Dr. Otelia SergeantNitka will not rx pain meds, I advised that I would see if Dr. Magnus IvanBlackman can get him something since he is planing to to do hip surgery on him.  He states that he has been out of pain meds for over a week and he has not heard anything about getting scheduled for surgery yet.  Please have Dr. Magnus IvanBlackman advise on refill of pain meds.

## 2017-04-04 ENCOUNTER — Other Ambulatory Visit (INDEPENDENT_AMBULATORY_CARE_PROVIDER_SITE_OTHER): Payer: Self-pay | Admitting: Physician Assistant

## 2017-04-11 NOTE — Patient Instructions (Addendum)
Quintella BatonJames C Bossi  04/11/2017   Your procedure is scheduled on: 04-14-17   [facility]  Entrance Report to Admitting at 6:45 AM   Call this number if you have problems the morning of surgery [phone]   Remember: Do not eat food or drink liquids :After Midnight       Take these medicines the morning of surgery with A SIP OF WATER: Amlodipine (Norvasc), Atenolol (Tenormin), and Gabapentin (Neurontin)                                You may not have any metal on your body including hair pins and              piercings  Do not wear jewelry, lotions, powders or deodorant             Do not wear nail polish.  Do not shave  48 hours prior to surgery.                Do not bring valuables to the hospital. [facility] IS NOT             RESPONSIBLE   FOR VALUABLES.  Contacts, dentures or bridgework may not be worn into surgery.  Leave suitcase in the car. After surgery it may be brought to your room.                Please read over the following fact sheets you were given: _____________________________________________________________________          [facility] - Preparing [facility] Before surgery, you can play an important role.  Because skin is not sterile, your skin needs to be as free of germs as possible.  You can reduce the number of germs on your skin by washing with CHG (chlorahexidine gluconate) soap before surgery.  CHG is an antiseptic cleaner which kills germs and bonds with the skin to continue killing germs even after washing. Please DO NOT use if you have an allergy to CHG or antibacterial soaps.  If your skin becomes reddened/irritated stop using the CHG and inform your nurse when you arrive at Short Stay. Do not shave (including legs and underarms) for at least 48 hours prior to the first CHG shower.  You may shave your face/neck. Please follow these instructions carefully:  1.  Shower with CHG Soap the night before surgery and the   morning of Surgery.  2.  If you choose to wash your hair, wash your hair first as usual with your  normal  shampoo.  3.  After you shampoo, rinse your hair and body thoroughly to remove the  shampoo.                           4.  Use CHG as you would any other liquid soap.  You can apply chg directly  to the skin and wash                       Gently with a scrungie or clean washcloth.  5.  Apply the CHG Soap to your body ONLY FROM THE NECK DOWN.   Do not use on face/ open  Wound or open sores. Avoid contact with eyes, ears mouth and genitals (private parts).                       Wash face,  Genitals (private parts) with your normal soap.             6.  Wash thoroughly, paying special attention to the area where your surgery  will be performed.  7.  Thoroughly rinse your body with warm water from the neck down.  8.  DO NOT shower/wash with your normal soap after using and rinsing off  the CHG Soap.                9.  Pat yourself dry with a clean towel.            10.  Wear clean pajamas.            11.  Place clean sheets on your bed the night of your first shower and do not  sleep with pets. Day of Surgery : Do not apply any lotions/deodorants the morning of surgery.  Please wear clean clothes to the hospital/surgery center.  FAILURE TO FOLLOW THESE INSTRUCTIONS MAY RESULT IN THE CANCELLATION OF YOUR SURGERY PATIENT SIGNATURE_________________________________  NURSE SIGNATURE__________________________________  ________________________________________________________________________   Adam Phenix  An incentive spirometer is a tool that can help keep your lungs clear and active. This tool measures how well you are filling your lungs with each breath. Taking long deep breaths may help reverse or decrease the chance of developing breathing (pulmonary) problems (especially infection) following:  A long period of time when you are unable to move or be  active. BEFORE THE PROCEDURE   If the spirometer includes an indicator to show your best effort, your nurse or respiratory therapist will set it to a desired goal.  If possible, sit up straight or lean slightly forward. Try not to slouch.  Hold the incentive spirometer in an upright position. INSTRUCTIONS FOR USE  1. Sit on the edge of your bed if possible, or sit up as far as you can in bed or on a chair. 2. Hold the incentive spirometer in an upright position. 3. Breathe out normally. 4. Place the mouthpiece in your mouth and seal your lips tightly around it. 5. Breathe in slowly and as deeply as possible, raising the piston or the ball toward the top of the column. 6. Hold your breath for 3-5 seconds or for as long as possible. Allow the piston or ball to fall to the bottom of the column. 7. Remove the mouthpiece from your mouth and breathe out normally. 8. Rest for a few seconds and repeat Steps 1 through 7 at least 10 times every 1-2 hours when you are awake. Take your time and take a few normal breaths between deep breaths. 9. The spirometer may include an indicator to show your best effort. Use the indicator as a goal to work toward during each repetition. 10. After each set of 10 deep breaths, practice coughing to be sure your lungs are clear. If you have an incision (the cut made at the time of surgery), support your incision when coughing by placing a pillow or rolled up towels firmly against it. Once you are able to get out of bed, walk around indoors and cough well. You may stop using the incentive spirometer when instructed by your caregiver.  RISKS AND COMPLICATIONS  Take your time so you do not get  dizzy or light-headed.  If you are in pain, you may need to take or ask for pain medication before doing incentive spirometry. It is harder to take a deep breath if you are having pain. AFTER USE  Rest and breathe slowly and easily.  It can be helpful to keep track of a log of  your progress. Your caregiver can provide you with a simple table to help with this. If you are using the spirometer at home, follow these instructions: [place] IF:   You are having difficultly using the spirometer.  You have trouble using the spirometer as often as instructed.  Your pain medication is not giving enough relief while using the spirometer.  You develop fever of 100.5 F (38.1 C) or higher. SEEK IMMEDIATE MEDICAL CARE IF:   You cough up bloody sputum that had not been present before.  You develop fever of 102 F (38.9 C) or greater.  You develop worsening pain at or near the incision site. MAKE SURE YOU:   Understand these instructions.  Will watch your condition.  Will get help right away if you are not doing well or get worse. Document Released: 07/18/2006 Document Revised: 05/30/2011 Document Reviewed: 09/18/2006 Exit[employer] Patient Information 2014 Exit[employer], [place].   ________________________________________________________________________  WHAT IS A BLOOD TRANSFUSION? Blood Transfusion Information  A transfusion is the replacement of blood or some of its parts. Blood is made up of multiple cells which provide different functions.  Red blood cells carry oxygen and are used for blood loss replacement.  White blood cells fight against infection.  Platelets control bleeding.  Plasma helps clot blood.  Other blood products are available for specialized needs, such as hemophilia or other clotting disorders. BEFORE THE TRANSFUSION  Who gives blood for transfusions?   Healthy volunteers who are fully evaluated to make sure their blood is safe. This is blood bank blood. Transfusion therapy is the safest it has ever been in the practice of medicine. Before blood is taken from a donor, a complete history is taken to make sure that person has no history of diseases nor engages in risky social behavior (examples are intravenous drug use or sexual activity  with multiple partners). The donor's travel history is screened to minimize risk of transmitting infections, such as malaria. The donated blood is tested for signs of infectious diseases, such as HIV and hepatitis. The blood is then tested to be sure it is compatible with you in order to minimize the chance of a transfusion reaction. If you or a relative donates blood, this is often done in anticipation of surgery and is not appropriate for emergency situations. It takes many days to process the donated blood. RISKS AND COMPLICATIONS Although transfusion therapy is very safe and saves many lives, the main dangers of transfusion include:   Getting an infectious disease.  Developing a transfusion reaction. This is an allergic reaction to something in the blood you were given. Every precaution is taken to prevent this. The decision to have a blood transfusion has been considered carefully by your caregiver before blood is given. Blood is not given unless the benefits outweigh the risks. AFTER THE TRANSFUSION  Right after receiving a blood transfusion, you will usually feel much better and more energetic. This is especially true if your red blood cells have gotten low (anemic). The transfusion raises the level of the red blood cells which carry oxygen, and this usually causes an energy increase.  The nurse administering the transfusion will  monitor you carefully for complications. HOME CARE INSTRUCTIONS  No special instructions are needed after a transfusion. You may find your energy is better. Speak with your caregiver about any limitations on activity for underlying diseases you may have. SEEK MEDICAL CARE IF:   Your condition is not improving after your transfusion.  You develop redness or irritation at the intravenous (IV) site. SEEK IMMEDIATE MEDICAL CARE IF:  Any of the following symptoms occur over the next 12 hours:  Shaking chills.  You have a temperature by mouth above 102 F (38.9  C), not controlled by medicine.  Chest, back, or muscle pain.  People around you feel you are not acting correctly or are confused.  Shortness of breath or difficulty breathing.  Dizziness and fainting.  You get a rash or develop hives.  You have a decrease in urine output.  Your urine turns a dark color or changes to pink, red, or brown. Any of the following symptoms occur over the next 10 days:  You have a temperature by mouth above 102 F (38.9 C), not controlled by medicine.  Shortness of breath.  Weakness after normal activity.  The white part of the eye turns yellow (jaundice).  You have a decrease in the amount of urine or are urinating less often.  Your urine turns a dark color or changes to pink, red, or brown. Document Released: 03/04/2000 Document Revised: 05/30/2011 Document Reviewed: 10/22/2007 [facility] Patient Information 2014 [place], [place].  _______________________________________________________________________

## 2017-04-12 ENCOUNTER — Other Ambulatory Visit (INDEPENDENT_AMBULATORY_CARE_PROVIDER_SITE_OTHER): Payer: Self-pay

## 2017-04-12 ENCOUNTER — Other Ambulatory Visit: Payer: Self-pay

## 2017-04-12 ENCOUNTER — Encounter (HOSPITAL_COMMUNITY)
Admission: RE | Admit: 2017-04-12 | Discharge: 2017-04-12 | Disposition: A | Discharge status: Home / Self Care | Payer: Medicare Other | Source: Ambulatory Visit | Attending: Orthopaedic Surgery | Admitting: Orthopaedic Surgery

## 2017-04-12 ENCOUNTER — Encounter (HOSPITAL_COMMUNITY): Payer: Self-pay

## 2017-04-12 LAB — CBC
HCT: 41.9 % (ref 39.0–52.0)
HEMOGLOBIN: 14.1 g/dL (ref 13.0–17.0)
MCH: 27.2 pg (ref 26.0–34.0)
MCHC: 33.7 g/dL (ref 30.0–36.0)
MCV: 80.7 fL (ref 78.0–100.0)
Platelets: 224 10*3/uL (ref 150–400)
RBC: 5.19 MIL/uL (ref 4.22–5.81)
RDW: 15.6 % — ABNORMAL HIGH (ref 11.5–15.5)
WBC: 4.4 10*3/uL (ref 4.0–10.5)

## 2017-04-12 LAB — ABO/RH: ABO/RH(D): A POS

## 2017-04-12 LAB — BASIC METABOLIC PANEL
ANION GAP: 8 (ref 5–15)
BUN: 13 mg/dL (ref 6–20)
CALCIUM: 9.2 mg/dL (ref 8.9–10.3)
CO2: 27 mmol/L (ref 22–32)
Chloride: 104 mmol/L (ref 101–111)
Creatinine, Ser: 0.86 mg/dL (ref 0.61–1.24)
GLUCOSE: 95 mg/dL (ref 65–99)
Potassium: 4 mmol/L (ref 3.5–5.1)
Sodium: 139 mmol/L (ref 135–145)

## 2017-04-12 LAB — SURGICAL PCR SCREEN
MRSA, PCR: INVALID — AB
STAPHYLOCOCCUS AUREUS: INVALID — AB

## 2017-04-12 NOTE — Progress Notes (Signed)
04-12-17 Discussed EKG results with Dr. Bradley FerrisEllender, Anesthesiologist. After review of chart, Dr. Bradley FerrisEllender indicated that pt can proceed with surgery.

## 2017-04-13 LAB — MRSA CULTURE: Culture: NOT DETECTED

## 2017-04-13 MED ORDER — TRANEXAMIC ACID 1000 MG/10ML IV SOLN
1000.0000 mg | INTRAVENOUS | Status: AC
  Administered 2017-04-14: 1000 mg via INTRAVENOUS
  Filled 2017-04-13: qty 1100

## 2017-04-13 NOTE — Anesthesia Preprocedure Evaluation (Addendum)
Anesthesia Evaluation  Patient identified by MRN, date of birth, ID band Patient awake    Airway Mallampati: II  TM Distance: >3 FB Neck ROM: Full    Dental  (+) Poor Dentition, Missing,    Pulmonary neg pulmonary ROS,    Pulmonary exam normal breath sounds clear to auscultation       Cardiovascular hypertension, negative cardio ROS Normal cardiovascular exam Rhythm:Regular Rate:Normal     Neuro/Psych    GI/Hepatic negative GI ROS, Neg liver ROS,   Endo/Other  negative endocrine ROS  Renal/GU negative Renal ROS  negative genitourinary   Musculoskeletal negative musculoskeletal ROS (+)   Abdominal   Peds  Hematology negative hematology ROS (+)   Anesthesia Other Findings   Reproductive/Obstetrics                            Lab Results  Component Value Date   WBC 4.4 04/12/2017   HGB 14.1 04/12/2017   HCT 41.9 04/12/2017   MCV 80.7 04/12/2017   PLT 224 04/12/2017   Lab Results  Component Value Date   CREATININE 0.86 04/12/2017   BUN 13 04/12/2017   NA 139 04/12/2017   K 4.0 04/12/2017   CL 104 04/12/2017   CO2 27 04/12/2017  No results found for: INR, PROTIME   Anesthesia Physical Anesthesia Plan  ASA: III  Anesthesia Plan: Spinal   Post-op Pain Management:    Induction:   PONV Risk Score and Plan: 1  Airway Management Planned: Mask, Natural Airway and Nasal Cannula  Additional Equipment:   Intra-op Plan:   Post-operative Plan: Extubation in OR  Informed Consent: I have reviewed the patients History and Physical, chart, labs and discussed the procedure including the risks, benefits and alternatives for the proposed anesthesia with the patient or authorized representative who has indicated his/her understanding and acceptance.     Plan Discussed with: CRNA and Anesthesiologist  Anesthesia Plan Comments:         Anesthesia Quick Evaluation

## 2017-04-14 ENCOUNTER — Inpatient Hospital Stay (HOSPITAL_COMMUNITY)
Admission: RE | Admit: 2017-04-14 | Discharge: 2017-04-16 | DRG: 470 | Disposition: A | Discharge status: Home / Self Care | Payer: Medicare Other | Source: Ambulatory Visit | Attending: Orthopaedic Surgery | Admitting: Orthopaedic Surgery

## 2017-04-14 ENCOUNTER — Inpatient Hospital Stay (HOSPITAL_COMMUNITY): Payer: Medicare Other

## 2017-04-14 ENCOUNTER — Encounter (HOSPITAL_COMMUNITY)
Admission: RE | Disposition: A | Discharge status: Home / Self Care | Payer: Self-pay | Source: Ambulatory Visit | Attending: Orthopaedic Surgery

## 2017-04-14 ENCOUNTER — Encounter (HOSPITAL_COMMUNITY): Payer: Self-pay | Admitting: Emergency Medicine

## 2017-04-14 ENCOUNTER — Inpatient Hospital Stay (HOSPITAL_COMMUNITY): Payer: Medicare Other | Admitting: Anesthesiology

## 2017-04-14 ENCOUNTER — Other Ambulatory Visit: Payer: Self-pay

## 2017-04-14 DIAGNOSIS — Z96641 Presence of right artificial hip joint: Secondary | ICD-10-CM

## 2017-04-14 DIAGNOSIS — M1611 Unilateral primary osteoarthritis, right hip: Principal | ICD-10-CM

## 2017-04-14 DIAGNOSIS — M25551 Pain in right hip: Secondary | ICD-10-CM | POA: Diagnosis present

## 2017-04-14 DIAGNOSIS — G8929 Other chronic pain: Secondary | ICD-10-CM | POA: Diagnosis present

## 2017-04-14 DIAGNOSIS — E7800 Pure hypercholesterolemia, unspecified: Secondary | ICD-10-CM | POA: Diagnosis present

## 2017-04-14 DIAGNOSIS — M109 Gout, unspecified: Secondary | ICD-10-CM | POA: Diagnosis present

## 2017-04-14 DIAGNOSIS — I10 Essential (primary) hypertension: Secondary | ICD-10-CM | POA: Diagnosis present

## 2017-04-14 DIAGNOSIS — Z419 Encounter for procedure for purposes other than remedying health state, unspecified: Secondary | ICD-10-CM

## 2017-04-14 HISTORY — PX: TOTAL HIP ARTHROPLASTY: SHX124

## 2017-04-14 LAB — TYPE AND SCREEN
ABO/RH(D): A POS
ANTIBODY SCREEN: NEGATIVE

## 2017-04-14 SURGERY — ARTHROPLASTY, HIP, TOTAL, ANTERIOR APPROACH
Anesthesia: Spinal | Laterality: Right

## 2017-04-14 MED ORDER — BUPIVACAINE IN DEXTROSE 0.75-8.25 % IT SOLN
INTRATHECAL | Status: DC | PRN
  Administered 2017-04-14: 2 mL via INTRATHECAL

## 2017-04-14 MED ORDER — DIPHENHYDRAMINE HCL 12.5 MG/5ML PO ELIX: 12.5000 mg | ORAL_SOLUTION | ORAL | Status: DC | PRN

## 2017-04-14 MED ORDER — METHOCARBAMOL 500 MG PO TABS
500.0000 mg | ORAL_TABLET | Freq: Four times a day (QID) | ORAL | Status: DC | PRN
  Administered 2017-04-15 – 2017-04-16 (×3): 500 mg via ORAL
  Filled 2017-04-14 (×3): qty 1

## 2017-04-14 MED ORDER — PHENOL 1.4 % MT LIQD: 1.0000 | OROMUCOSAL | Status: DC | PRN

## 2017-04-14 MED ORDER — CEFAZOLIN SODIUM-DEXTROSE 1-4 GM/50ML-% IV SOLN
1.0000 g | Freq: Four times a day (QID) | INTRAVENOUS | Status: AC
  Administered 2017-04-14 (×2): 1 g via INTRAVENOUS
  Filled 2017-04-14 (×2): qty 50

## 2017-04-14 MED ORDER — ACETAMINOPHEN 325 MG PO TABS: 650.0000 mg | ORAL_TABLET | ORAL | Status: DC | PRN

## 2017-04-14 MED ORDER — CHLORHEXIDINE GLUCONATE 4 % EX LIQD: 60.0000 mL | Freq: Once | CUTANEOUS | Status: DC

## 2017-04-14 MED ORDER — OXYCODONE HCL 5 MG PO TABS
10.0000 mg | ORAL_TABLET | ORAL | Status: DC | PRN
  Administered 2017-04-14 – 2017-04-15 (×6): 10 mg via ORAL
  Filled 2017-04-14 (×6): qty 2

## 2017-04-14 MED ORDER — FENTANYL CITRATE (PF) 100 MCG/2ML IJ SOLN
INTRAMUSCULAR | Status: DC | PRN
  Administered 2017-04-14 (×2): 50 ug via INTRAVENOUS

## 2017-04-14 MED ORDER — METHOCARBAMOL 1000 MG/10ML IJ SOLN
500.0000 mg | Freq: Four times a day (QID) | INTRAVENOUS | Status: DC | PRN
  Administered 2017-04-14: 500 mg via INTRAVENOUS
  Filled 2017-04-14: qty 550

## 2017-04-14 MED ORDER — FENTANYL CITRATE (PF) 100 MCG/2ML IJ SOLN
INTRAMUSCULAR | Status: AC
  Filled 2017-04-14: qty 2

## 2017-04-14 MED ORDER — HYDROMORPHONE HCL 1 MG/ML IJ SOLN
1.0000 mg | INTRAMUSCULAR | Status: DC | PRN
  Administered 2017-04-14: 1 mg via INTRAVENOUS
  Filled 2017-04-14: qty 1

## 2017-04-14 MED ORDER — ACETAMINOPHEN 650 MG RE SUPP: 650.0000 mg | RECTAL | Status: DC | PRN

## 2017-04-14 MED ORDER — PHENYLEPHRINE 40 MCG/ML (10ML) SYRINGE FOR IV PUSH (FOR BLOOD PRESSURE SUPPORT)
PREFILLED_SYRINGE | INTRAVENOUS | Status: DC | PRN
  Administered 2017-04-14 (×5): 80 ug via INTRAVENOUS

## 2017-04-14 MED ORDER — DOCUSATE SODIUM 100 MG PO CAPS
100.0000 mg | ORAL_CAPSULE | Freq: Two times a day (BID) | ORAL | Status: DC
  Administered 2017-04-14 – 2017-04-16 (×4): 100 mg via ORAL
  Filled 2017-04-14 (×4): qty 1

## 2017-04-14 MED ORDER — PROPOFOL 10 MG/ML IV BOLUS
INTRAVENOUS | Status: AC
  Filled 2017-04-14: qty 40

## 2017-04-14 MED ORDER — POLYETHYLENE GLYCOL 3350 17 G PO PACK: 17.0000 g | PACK | Freq: Every day | ORAL | Status: DC | PRN

## 2017-04-14 MED ORDER — ONDANSETRON HCL 4 MG PO TABS: 4.0000 mg | ORAL_TABLET | Freq: Four times a day (QID) | ORAL | Status: DC | PRN

## 2017-04-14 MED ORDER — PHENYLEPHRINE 40 MCG/ML (10ML) SYRINGE FOR IV PUSH (FOR BLOOD PRESSURE SUPPORT)
PREFILLED_SYRINGE | INTRAVENOUS | Status: AC
  Filled 2017-04-14: qty 10

## 2017-04-14 MED ORDER — ATENOLOL 50 MG PO TABS
50.0000 mg | ORAL_TABLET | Freq: Every day | ORAL | Status: DC
  Administered 2017-04-16: 50 mg via ORAL
  Filled 2017-04-14 (×2): qty 1

## 2017-04-14 MED ORDER — PHENYLEPHRINE HCL 10 MG/ML IJ SOLN
INTRAMUSCULAR | Status: AC
  Filled 2017-04-14: qty 1

## 2017-04-14 MED ORDER — CEFAZOLIN SODIUM-DEXTROSE 2-4 GM/100ML-% IV SOLN
2.0000 g | INTRAVENOUS | Status: AC
  Administered 2017-04-14: 2 g via INTRAVENOUS
  Filled 2017-04-14: qty 100

## 2017-04-14 MED ORDER — METOCLOPRAMIDE HCL 5 MG PO TABS: 5.0000 mg | ORAL_TABLET | Freq: Three times a day (TID) | ORAL | Status: DC | PRN

## 2017-04-14 MED ORDER — METOCLOPRAMIDE HCL 5 MG/ML IJ SOLN: 5.0000 mg | Freq: Three times a day (TID) | INTRAMUSCULAR | Status: DC | PRN

## 2017-04-14 MED ORDER — MIDAZOLAM HCL 5 MG/5ML IJ SOLN
INTRAMUSCULAR | Status: DC | PRN
  Administered 2017-04-14 (×2): 1 mg via INTRAVENOUS

## 2017-04-14 MED ORDER — PROPOFOL 500 MG/50ML IV EMUL
INTRAVENOUS | Status: DC | PRN
  Administered 2017-04-14: 100 ug/kg/min via INTRAVENOUS

## 2017-04-14 MED ORDER — LACTATED RINGERS IV SOLN
INTRAVENOUS | Status: DC
  Administered 2017-04-14 (×2): via INTRAVENOUS

## 2017-04-14 MED ORDER — HYDROCHLOROTHIAZIDE 25 MG PO TABS
25.0000 mg | ORAL_TABLET | Freq: Every day | ORAL | Status: DC
  Administered 2017-04-16: 10:00:00 25 mg via ORAL
  Filled 2017-04-14 (×2): qty 1

## 2017-04-14 MED ORDER — ONDANSETRON HCL 4 MG/2ML IJ SOLN: 4.0000 mg | Freq: Four times a day (QID) | INTRAMUSCULAR | Status: DC | PRN

## 2017-04-14 MED ORDER — ASPIRIN 81 MG PO CHEW
81.0000 mg | CHEWABLE_TABLET | Freq: Two times a day (BID) | ORAL | Status: DC
  Administered 2017-04-14 – 2017-04-16 (×4): 81 mg via ORAL
  Filled 2017-04-14 (×4): qty 1

## 2017-04-14 MED ORDER — ALUM & MAG HYDROXIDE-SIMETH 200-200-20 MG/5ML PO SUSP: 30.0000 mL | ORAL | Status: DC | PRN

## 2017-04-14 MED ORDER — MENTHOL 3 MG MT LOZG: 1.0000 | LOZENGE | OROMUCOSAL | Status: DC | PRN

## 2017-04-14 MED ORDER — SODIUM CHLORIDE 0.9 % IR SOLN
Status: DC | PRN
  Administered 2017-04-14: 1000 mL

## 2017-04-14 MED ORDER — DEXTROSE 5 % IV SOLN
INTRAVENOUS | Status: DC | PRN
  Administered 2017-04-14: 25 ug/min via INTRAVENOUS

## 2017-04-14 MED ORDER — GABAPENTIN 300 MG PO CAPS: 300.0000 mg | ORAL_CAPSULE | Freq: Three times a day (TID) | ORAL | Status: DC | PRN

## 2017-04-14 MED ORDER — PROPOFOL 10 MG/ML IV BOLUS
INTRAVENOUS | Status: AC
  Filled 2017-04-14: qty 20

## 2017-04-14 MED ORDER — LOSARTAN POTASSIUM 50 MG PO TABS
50.0000 mg | ORAL_TABLET | Freq: Every day | ORAL | Status: DC
  Administered 2017-04-15 – 2017-04-16 (×2): 50 mg via ORAL
  Filled 2017-04-14 (×2): qty 1

## 2017-04-14 MED ORDER — MIDAZOLAM HCL 2 MG/2ML IJ SOLN
INTRAMUSCULAR | Status: AC
  Filled 2017-04-14: qty 2

## 2017-04-14 MED ORDER — ROSUVASTATIN CALCIUM 5 MG PO TABS
5.0000 mg | ORAL_TABLET | Freq: Every day | ORAL | Status: DC
  Administered 2017-04-15 – 2017-04-16 (×2): 5 mg via ORAL
  Filled 2017-04-14 (×2): qty 1

## 2017-04-14 MED ORDER — HYDROCODONE-ACETAMINOPHEN 5-325 MG PO TABS
1.0000 | ORAL_TABLET | ORAL | Status: DC | PRN
  Administered 2017-04-16 (×3): 2 via ORAL
  Filled 2017-04-14 (×3): qty 2

## 2017-04-14 MED ORDER — SODIUM CHLORIDE 0.9 % IV SOLN
INTRAVENOUS | Status: DC
  Administered 2017-04-14 – 2017-04-15 (×2): via INTRAVENOUS

## 2017-04-14 SURGICAL SUPPLY — 37 items
APL SKNCLS STERI-STRIP NONHPOA (GAUZE/BANDAGES/DRESSINGS)
BAG ZIPLOCK 12X15 (MISCELLANEOUS) IMPLANT
BENZOIN TINCTURE PRP APPL 2/3 (GAUZE/BANDAGES/DRESSINGS) IMPLANT
BLADE SAW SGTL 18X1.27X75 (BLADE) ×2 IMPLANT
BLADE SAW SGTL 18X1.27X75MM (BLADE) ×1
CAPT HIP TOTAL 2 ×3 IMPLANT
CLOSURE WOUND 1/2 X4 (GAUZE/BANDAGES/DRESSINGS)
COVER PERINEAL POST (MISCELLANEOUS) ×3 IMPLANT
COVER SURGICAL LIGHT HANDLE (MISCELLANEOUS) ×3 IMPLANT
DRAPE STERI IOBAN 125X83 (DRAPES) ×3 IMPLANT
DRAPE U-SHAPE 47X51 STRL (DRAPES) ×6 IMPLANT
DRESSING AQUACEL AG SP 3.5X10 (GAUZE/BANDAGES/DRESSINGS) ×1 IMPLANT
DRSG AQUACEL AG ADV 3.5X10 (GAUZE/BANDAGES/DRESSINGS) ×3 IMPLANT
DRSG AQUACEL AG SP 3.5X10 (GAUZE/BANDAGES/DRESSINGS) ×3
DRSG XEROFORM 1X8 (GAUZE/BANDAGES/DRESSINGS) ×3 IMPLANT
DURAPREP 26ML APPLICATOR (WOUND CARE) ×3 IMPLANT
ELECT REM PT RETURN 15FT ADLT (MISCELLANEOUS) ×3 IMPLANT
GAUZE XEROFORM 1X8 LF (GAUZE/BANDAGES/DRESSINGS) IMPLANT
GLOVE BIO SURGEON STRL SZ7.5 (GLOVE) ×3 IMPLANT
GLOVE BIOGEL PI IND STRL 8 (GLOVE) ×2 IMPLANT
GLOVE BIOGEL PI INDICATOR 8 (GLOVE) ×4
GLOVE ECLIPSE 8.0 STRL XLNG CF (GLOVE) ×3 IMPLANT
GOWN STRL REUS W/TWL XL LVL3 (GOWN DISPOSABLE) ×6 IMPLANT
HANDPIECE INTERPULSE COAX TIP (DISPOSABLE) ×2
HOLDER FOLEY CATH W/STRAP (MISCELLANEOUS) ×3 IMPLANT
PACK ANTERIOR HIP CUSTOM (KITS) ×3 IMPLANT
SET HNDPC FAN SPRY TIP SCT (DISPOSABLE) ×1 IMPLANT
STAPLER VISISTAT 35W (STAPLE) IMPLANT
STRIP CLOSURE SKIN 1/2X4 (GAUZE/BANDAGES/DRESSINGS) IMPLANT
SUT ETHIBOND NAB CT1 #1 30IN (SUTURE) ×3 IMPLANT
SUT MNCRL AB 4-0 PS2 18 (SUTURE) IMPLANT
SUT VIC AB 0 CT1 36 (SUTURE) ×3 IMPLANT
SUT VIC AB 1 CT1 36 (SUTURE) ×3 IMPLANT
SUT VIC AB 2-0 CT1 27 (SUTURE) ×4
SUT VIC AB 2-0 CT1 TAPERPNT 27 (SUTURE) ×2 IMPLANT
TRAY FOLEY W/METER SILVER 16FR (SET/KITS/TRAYS/PACK) ×3 IMPLANT
YANKAUER SUCT BULB TIP 10FT TU (MISCELLANEOUS) ×3 IMPLANT

## 2017-04-14 NOTE — Evaluation (Signed)
Physical Therapy Evaluation Patient Details Name: Jeffery White MRN: [id number] DOB: 18-Sep-1933 Today's Date: 04/14/2017   History of Present Illness  Pt s/p R THR  Clinical Impression  Pt s/p R THR and presents with decreased R LE strength/ROM and post op pain limiting functional mobility.  Pt should progress to dc home with family assist.    Follow Up Recommendations DC plan and follow up therapy as arranged by surgeon    Equipment Recommendations  Rolling walker with 5" wheels    Recommendations for Other Services       Precautions / Restrictions Precautions Precautions: Fall Restrictions Weight Bearing Restrictions: No Other Position/Activity Restrictions: WBAT      Mobility  Bed Mobility Overal bed mobility: Needs Assistance Bed Mobility: Supine to Sit     Supine to sit: Min guard     General bed mobility comments: cues for sequence and use of L LE to self assist  Transfers Overall transfer level: Needs assistance   Transfers: Sit to/from Stand Sit to Stand: Min assist         General transfer comment: cues for LE management and use of UEs to self assist  Ambulation/Gait Ambulation/Gait assistance: Min assist;Min guard Ambulation Distance (Feet): 120 Feet Assistive device: Rolling walker (2 wheeled) Gait Pattern/deviations: Step-to pattern;Step-through pattern;Shuffle;Trunk flexed Gait velocity: decr Gait velocity interpretation: Below normal speed for age/gender General Gait Details: cues for posture, position from RW and sequence  Stairs            Wheelchair Mobility    Modified Rankin (Stroke Patients Only)       Balance Overall balance assessment: No apparent balance deficits (not formally assessed)                                           Pertinent Vitals/Pain Pain Assessment: 0-10 Pain Score: 4  Pain Location: R hip Pain Descriptors / Indicators: Aching;Sore Pain Intervention(s): Limited activity within  patient's tolerance;Monitored during session;Premedicated before session;Ice applied    Home Living Family/patient expects to be discharged to:: Private residence Living Arrangements: Spouse/significant other Available Help at Discharge: Family Type of Home: House Home Access: Stairs to enter Entrance Stairs-Rails: None Entrance Stairs-Number of Steps: 2 Home Layout: One level Home Equipment: Cane - single point      Prior Function Level of Independence: Independent with assistive device(s);Independent         Comments: Used cane as needed     Hand Dominance        Extremity/Trunk Assessment   Upper Extremity Assessment Upper Extremity Assessment: Overall WFL for tasks assessed    Lower Extremity Assessment Lower Extremity Assessment: RLE deficits/detail       Communication   Communication: No difficulties  Cognition Arousal/Alertness: Awake/alert Behavior During Therapy: WFL for tasks assessed/performed Overall Cognitive Status: Within Functional Limits for tasks assessed                                        General Comments      Exercises Total Joint Exercises Ankle Circles/Pumps: AROM;Both;Supine;20 reps   Assessment/Plan    PT Assessment Patient needs continued PT services  PT Problem List Decreased strength;Decreased range of motion;Decreased activity tolerance;Decreased mobility;Decreased knowledge of use of DME;Obesity;Pain       PT Treatment Interventions  DME instruction;Gait training;Stair training;Functional mobility training;Therapeutic activities;Therapeutic exercise;Patient/family education    PT Goals (Current goals can be found in the Care Plan section)  Acute Rehab PT Goals Patient Stated Goal: Regain IND PT Goal Formulation: With patient Time For Goal Achievement: 04/21/17 Potential to Achieve Goals: Good    Frequency 7X/week   Barriers to discharge        Co-evaluation               AM-PAC PT "6  Clicks" Daily Activity  Outcome Measure Difficulty turning over in bed (including adjusting bedclothes, sheets and blankets)?: A Lot Difficulty moving from lying on back to sitting on the side of the bed? : A Lot Difficulty sitting down on and standing up from a chair with arms (e.g., wheelchair, bedside commode, etc,.)?: A Lot Help needed moving to and from a bed to chair (including a wheelchair)?: A Little Help needed walking in hospital room?: A Little Help needed climbing 3-5 steps with a railing? : A Little 6 Click Score: 15    End of Session Equipment Utilized During Treatment: Gait belt Activity Tolerance: Patient tolerated treatment well Patient left: in chair;with call bell/phone within reach;with chair alarm set Nurse Communication: Mobility status PT Visit Diagnosis: Difficulty in walking, not elsewhere classified (R26.2)    Time: [id number] PT Time Calculation (min) (ACUTE ONLY): 18 min   Charges:   PT Evaluation $PT Eval Low Complexity: 1 Low     PT G Codes:        Pg [phone]   Claudis Giovanelli 04/14/2017, 6:59 PM

## 2017-04-14 NOTE — Anesthesia Postprocedure Evaluation (Signed)
Anesthesia Post Note  Patient: Jeffery White  Procedure(s) Performed: RIGHT TOTAL HIP ARTHROPLASTY ANTERIOR APPROACH (Right )     Patient location during evaluation: PACU Anesthesia Type: Spinal Level of consciousness: awake and alert Pain management: pain level controlled Vital Signs Assessment: post-procedure vital signs reviewed and stable Respiratory status: nonlabored ventilation and spontaneous breathing Cardiovascular status: stable Postop Assessment: no headache, no backache, epidural receding and adequate PO intake Anesthetic complications: yes    Last Vitals:  Vitals:   04/14/17 0631 04/14/17 0641  BP:  (!) 155/97  Pulse: 78   Resp: 18   Temp: 36.7 C   SpO2: 98%     Last Pain:  Vitals:   04/14/17 0631  TempSrc: Oral                 Trevor IhaStephen A Icey Tello

## 2017-04-14 NOTE — Anesthesia Procedure Notes (Signed)
Spinal  Patient location during procedure: OR Start time: 04/14/2017 8:45 AM End time: 04/14/2017 8:49 AM Staffing Anesthesiologist: Trevor IhaHouser, Stephen A, MD Performed: anesthesiologist  Spinal Block Patient position: sitting Prep: Betadine Patient monitoring: heart rate, cardiac monitor, continuous pulse ox and blood pressure Approach: midline Location: L3-4 Injection technique: single-shot Needle Needle type: Spinocan  Needle gauge: 22 G Needle length: 9 cm

## 2017-04-14 NOTE — Brief Op Note (Signed)
04/14/2017  9:58 AM  PATIENT:  Quintella BatonJames C Gitto  84 y.o. male  PRE-OPERATIVE DIAGNOSIS:  osteoarthritis right hip  POST-OPERATIVE DIAGNOSIS:  osteoarthritis right hip  PROCEDURE:  Procedure(s): RIGHT TOTAL HIP ARTHROPLASTY ANTERIOR APPROACH (Right)  SURGEON:  Surgeon(s) and Role:    Kathryne Hitch* Jobie Popp Y, MD - Primary  PHYSICIAN ASSISTANT: Rexene EdisonGil Clark, PA-C  ANESTHESIA:   spinal  EBL:  250 mL   COUNTS:  YES  DICTATION: .Other Dictation: Dictation Number [id number]  PLAN OF CARE: Admit to inpatient   PATIENT DISPOSITION:  PACU - hemodynamically stable.   Delay start of Pharmacological VTE agent (>24hrs) due to surgical blood loss or risk of bleeding: no

## 2017-04-14 NOTE — H&P (Signed)
TOTAL HIP ADMISSION H&P  Patient is admitted for right total hip arthroplasty.  Subjective:  Chief Complaint: right hip pain  HPI: Jeffery White, 84 y.o. male, has a history of pain and functional disability in the right hip(s) due to arthritis and patient has failed non-surgical conservative treatments for greater than 12 weeks to include NSAID's and/or analgesics, corticosteriod injections, flexibility and strengthening excercises, use of assistive devices, weight reduction as appropriate and activity modification.  Onset of symptoms was gradual starting 3 years ago with gradually worsening course since that time.The patient noted no past surgery on the right hip(s).  Patient currently rates pain in the right hip at 9 out of 10 with activity. Patient has night pain, worsening of pain with activity and weight bearing, trendelenberg gait, pain that interfers with activities of daily living and pain with passive range of motion. Patient has evidence of subchondral sclerosis, periarticular osteophytes and joint space narrowing by imaging studies. This condition presents safety issues increasing the risk of falls.  There is no current active infection.  Patient Active Problem List   Diagnosis Date Noted  . Unilateral primary osteoarthritis, right hip 03/06/2017   Past Medical History:  Diagnosis Date  . Chronic back pain   . Gout   . Hypercholesteremia   . Hypertension     Past Surgical History:  Procedure Laterality Date  . COLONOSCOPY      Current Facility-Administered Medications  Medication Dose Route Frequency Provider Last Rate Last Dose  . ceFAZolin (ANCEF) IVPB 2g/100 mL premix  2 g Intravenous On Call to OR Teressa Lower, RPH      . chlorhexidine (HIBICLENS) 4 % liquid 4 application  60 mL Topical Once Kirtland Bouchard, PA-C      . lactated ringers infusion   Intravenous Continuous Eilene Ghazi, MD 50 mL/hr at 04/14/17 [phone]    . tranexamic acid (CYKLOKAPRON) 1,000 mg in sodium  chloride 0.9 % 100 mL IVPB  1,000 mg Intravenous To OR Kathryne Hitch, MD       No Known Allergies  Social History   Tobacco Use  . Smoking status: Never Smoker  . Smokeless tobacco: Never Used  Substance Use Topics  . Alcohol use: No    History reviewed. No pertinent family history.   Review of Systems  Musculoskeletal: Positive for joint pain.  All other systems reviewed and are negative.   Objective:  Physical Exam  Constitutional: He is oriented to person, place, and time. He appears well-developed and well-nourished.  HENT:  Head: Normocephalic and atraumatic.  Eyes: EOM are normal. Pupils are equal, round, and reactive to light.  Neck: Normal range of motion. Neck supple.  Cardiovascular: Normal rate.  Respiratory: Effort normal.  GI: Soft. Bowel sounds are normal.  Musculoskeletal:       Right hip: He exhibits decreased range of motion, decreased strength, tenderness and bony tenderness.  Neurological: He is alert and oriented to person, place, and time.  Skin: Skin is warm and dry.  Psychiatric: He has a normal mood and affect.    Vital signs in last 24 hours: Temp:  [98 F (36.7 C)] 98 F (36.7 C) (01/25 0631) Pulse Rate:  [78] 78 (01/25 0631) Resp:  [18] 18 (01/25 0631) BP: (155)/(97) 155/97 (01/25 0641) SpO2:  [98 %] 98 % (01/25 0631) Weight:  [256 lb (116.1 kg)] 256 lb (116.1 kg) (01/25 0656)  Labs:   Estimated body mass index is 34.72 kg/m as calculated from the  following:   Height as of this encounter: 6' (1.829 m).   Weight as of this encounter: 256 lb (116.1 kg).   Imaging Review Plain radiographs demonstrate severe degenerative joint disease of the right hip(s). The bone quality appears to be good for age and reported activity level.  Assessment/Plan:  End stage arthritis, right hip(s)  The patient history, physical examination, clinical judgement of the provider and imaging studies are consistent with end stage degenerative  joint disease of the right hip(s) and total hip arthroplasty is deemed medically necessary. The treatment options including medical management, injection therapy, arthroscopy and arthroplasty were discussed at length. The risks and benefits of total hip arthroplasty were presented and reviewed. The risks due to aseptic loosening, infection, stiffness, dislocation/subluxation,  thromboembolic complications and other imponderables were discussed.  The patient acknowledged the explanation, agreed to proceed with the plan and consent was signed. Patient is being admitted for inpatient treatment for surgery, pain control, PT, OT, prophylactic antibiotics, VTE prophylaxis, progressive ambulation and ADL's and discharge planning.The patient is planning to be discharged home with home health services

## 2017-04-14 NOTE — Transfer of Care (Signed)
Immediate Anesthesia Transfer of Care Note  Patient: Jeffery BatonJames C Welden  Procedure(s) Performed: RIGHT TOTAL HIP ARTHROPLASTY ANTERIOR APPROACH (Right )  Patient Location: PACU  Anesthesia Type:Spinal  Level of Consciousness: sedated  Airway & Oxygen Therapy: Patient Spontanous Breathing and Patient connected to face mask oxygen  Post-op Assessment: Report given to RN and Post -op Vital signs reviewed and stable  Post vital signs: Reviewed and stable  Last Vitals:  Vitals:   04/14/17 0631 04/14/17 0641  BP:  (!) 155/97  Pulse: 78   Resp: 18   Temp: 36.7 C   SpO2: 98%     Last Pain:  Vitals:   04/14/17 0631  TempSrc: Oral      Patients Stated Pain Goal: 4 (04/14/17 0656)  Complications: No apparent anesthesia complications

## 2017-04-15 LAB — CBC
HCT: 36.1 % — ABNORMAL LOW (ref 39.0–52.0)
Hemoglobin: 12.1 g/dL — ABNORMAL LOW (ref 13.0–17.0)
MCH: 27.1 pg (ref 26.0–34.0)
MCHC: 33.5 g/dL (ref 30.0–36.0)
MCV: 80.8 fL (ref 78.0–100.0)
PLATELETS: 197 10*3/uL (ref 150–400)
RBC: 4.47 MIL/uL (ref 4.22–5.81)
RDW: 16 % — AB (ref 11.5–15.5)
WBC: 11.7 10*3/uL — ABNORMAL HIGH (ref 4.0–10.5)

## 2017-04-15 LAB — BASIC METABOLIC PANEL
Anion gap: 7 (ref 5–15)
BUN: 11 mg/dL (ref 6–20)
CALCIUM: 8.2 mg/dL — AB (ref 8.9–10.3)
CO2: 28 mmol/L (ref 22–32)
CREATININE: 0.74 mg/dL (ref 0.61–1.24)
Chloride: 100 mmol/L — ABNORMAL LOW (ref 101–111)
Glucose, Bld: 120 mg/dL — ABNORMAL HIGH (ref 65–99)
Potassium: 3.6 mmol/L (ref 3.5–5.1)
SODIUM: 135 mmol/L (ref 135–145)

## 2017-04-15 MED ORDER — OXYCODONE HCL 5 MG PO TABS: 5.0000 mg | ORAL_TABLET | ORAL | 0 refills | Status: DC | PRN

## 2017-04-15 MED ORDER — ASPIRIN 81 MG PO CHEW: 81.0000 mg | CHEWABLE_TABLET | Freq: Two times a day (BID) | ORAL | 0 refills | Status: DC

## 2017-04-15 NOTE — Progress Notes (Signed)
Subjective: 1 Day Post-Op Procedure(s) (LRB): RIGHT TOTAL HIP ARTHROPLASTY ANTERIOR APPROACH (Right) Patient reports pain as moderate.  Says he feels good and looking forward to therapy.  Wants to go home post-hospital stay.  Objective: Vital signs in last 24 hours: Temp:  [97.4 F (36.3 C)-98.6 F (37 C)] 98.5 F (36.9 C) (01/26 0610) Pulse Rate:  [51-90] 80 (01/26 0153) Resp:  [12-22] 16 (01/26 0610) BP: (78-154)/(55-97) 139/83 (01/26 0610) SpO2:  [92 %-100 %] 98 % (01/26 0610)  Intake/Output from previous day: 01/25 0701 - 01/26 0700 In: 2872.5 [P.O.:460; I.V.:2362.5; IV Piggyback:50] Out: 1375 [Urine:1125; Blood:250] Intake/Output this shift: No intake/output data recorded.  Recent Labs    04/12/17 1016 04/15/17 0536  HGB 14.1 12.1*   Recent Labs    04/12/17 1016 04/15/17 0536  WBC 4.4 11.7*  RBC 5.19 4.47  HCT 41.9 36.1*  PLT 224 197   Recent Labs    04/12/17 1016 04/15/17 0536  NA 139 135  K 4.0 3.6  CL 104 100*  CO2 27 28  BUN 13 11  CREATININE 0.86 0.74  GLUCOSE 95 120*  CALCIUM 9.2 8.2*   No results for input(s): LABPT, INR in the last 72 hours.  Sensation intact distally Intact pulses distally Dorsiflexion/Plantar flexion intact Incision: scant drainage  Assessment/Plan: 1 Day Post-Op Procedure(s) (LRB): RIGHT TOTAL HIP ARTHROPLASTY ANTERIOR APPROACH (Right) Up with therapy Discharge home with home health next 1-2 days.  Kathryne HitchChristopher Y Zaydah Nawabi 04/15/2017, 7:35 AM

## 2017-04-15 NOTE — Progress Notes (Signed)
Physical Therapy Treatment Patient Details Name: Jeffery White MRN: [id number] DOB: 1933-11-06 Today's Date: 04/15/2017    History of Present Illness Pt s/p R THR    PT Comments    Pt very motivated, progressing well with mobility and hopeful for dc home tomorrow.   Follow Up Recommendations  DC plan and follow up therapy as arranged by surgeon     Equipment Recommendations  Rolling walker with 5" wheels    Recommendations for Other Services       Precautions / Restrictions Precautions Precautions: Fall Restrictions Weight Bearing Restrictions: No RLE Weight Bearing: Weight bearing as tolerated Other Position/Activity Restrictions: WBAT    Mobility  Bed Mobility Overal bed mobility: Needs Assistance Bed Mobility: Supine to Sit     Supine to sit: Supervision     General bed mobility comments: cues for sequence and use of L LE to self assist  Transfers Overall transfer level: Needs assistance Equipment used: Rolling walker (2 wheeled) Transfers: Sit to/from Stand Sit to Stand: Min guard Stand pivot transfers: Min assist       General transfer comment: cues for LE management and use of UEs to self assist  Ambulation/Gait Ambulation/Gait assistance: Min assist;Min guard Ambulation Distance (Feet): 280 Feet Assistive device: Rolling walker (2 wheeled) Gait Pattern/deviations: Step-to pattern;Step-through pattern;Shuffle;Trunk flexed Gait velocity: decr Gait velocity interpretation: Below normal speed for age/gender General Gait Details: cues for posture, position from RW and sequence   Stairs            Wheelchair Mobility    Modified Rankin (Stroke Patients Only)       Balance Overall balance assessment: No apparent balance deficits (not formally assessed)                                          Cognition Arousal/Alertness: Awake/alert Behavior During Therapy: WFL for tasks assessed/performed Overall Cognitive Status:  Within Functional Limits for tasks assessed                                        Exercises Total Joint Exercises Ankle Circles/Pumps: AROM;Both;Supine;20 reps Quad Sets: AROM;Both;10 reps;Supine Heel Slides: AAROM;Right;20 reps;Supine Hip ABduction/ADduction: AAROM;Right;15 reps;Supine    General Comments        Pertinent Vitals/Pain Pain Assessment: 0-10 Pain Score: 6  Pain Location: R hip Pain Descriptors / Indicators: Aching;Sore Pain Intervention(s): Limited activity within patient's tolerance;Monitored during session;Premedicated before session;Ice applied    Home Living Family/patient expects to be discharged to:: Private residence Living Arrangements: Spouse/significant other Available Help at Discharge: Family Type of Home: House Home Access: Stairs to enter Entrance Stairs-Rails: None Home Layout: One level Home Equipment: Cane - single point      Prior Function Level of Independence: Independent with assistive device(s);Independent      Comments: Used cane as needed   PT Goals (current goals can now be found in the care plan section) Acute Rehab PT Goals Patient Stated Goal: Regain IND PT Goal Formulation: With patient Time For Goal Achievement: 04/21/17 Potential to Achieve Goals: Good Progress towards PT goals: Progressing toward goals    Frequency    7X/week      PT Plan Current plan remains appropriate    Co-evaluation  AM-PAC PT "6 Clicks" Daily Activity  Outcome Measure  Difficulty turning over in bed (including adjusting bedclothes, sheets and blankets)?: A Little Difficulty moving from lying on back to sitting on the side of the bed? : A Little Difficulty sitting down on and standing up from a chair with arms (e.g., wheelchair, bedside commode, etc,.)?: A Little Help needed moving to and from a bed to chair (including a wheelchair)?: A Little Help needed walking in hospital room?: A Little Help  needed climbing 3-5 steps with a railing? : A Little 6 Click Score: 18    End of Session Equipment Utilized During Treatment: Gait belt Activity Tolerance: Patient tolerated treatment well Patient left: in chair;with call bell/phone within reach;with chair alarm set Nurse Communication: Mobility status PT Visit Diagnosis: Difficulty in walking, not elsewhere classified (R26.2)     Time: 1610-96040944-1010 PT Time Calculation (min) (ACUTE ONLY): 26 min  Charges:  $Gait Training: 8-22 mins $Therapeutic Exercise: 8-22 mins                    G Codes:       Pg [phone]    Yula Crotwell 04/15/2017, 12:45 PM

## 2017-04-15 NOTE — Evaluation (Signed)
Occupational Therapy Evaluation Patient Details Name: Jeffery White MRN: [id number] DOB: 1934-02-15 Today's Date: 04/15/2017    History of Present Illness Pt s/p R THR   Clinical Impression   Pt is s/p THA resulting in the deficits listed below (see OT Problem List).  Pt will benefit from skilled OT to increase their safety and independence with ADL and functional mobility for ADL to facilitate discharge to venue listed below.        Follow Up Recommendations  No OT follow up    Equipment Recommendations  3 in 1 bedside commode    Recommendations for Other Services       Precautions / Restrictions Restrictions Weight Bearing Restrictions: No RLE Weight Bearing: Weight bearing as tolerated      Mobility Bed Mobility Overal bed mobility: Needs Assistance Bed Mobility: Supine to Sit     Supine to sit: Min assist     General bed mobility comments: cues for sequence and use of L LE to self assist  Transfers Overall transfer level: Needs assistance Equipment used: Rolling walker (2 wheeled) Transfers: Sit to/from [employer] Sit to Stand: Min guard Stand pivot transfers: Min assist       General transfer comment: cues for LE management and use of UEs to self assist    Balance Overall balance assessment: No apparent balance deficits (not formally assessed)                                         ADL either performed or assessed with clinical judgement   ADL Overall ADL's : Needs assistance/impaired Eating/Feeding: Set up;Sitting   Grooming: Set up;Sitting   Upper Body Bathing: Set up;Sitting   Lower Body Bathing: Set up;Sit to/from stand   Upper Body Dressing : Set up;Sitting   Lower Body Dressing: Moderate assistance;Sit to/from stand;Cueing for sequencing;Cueing for safety   Toilet Transfer: Minimal assistance;RW   Toileting- Clothing Manipulation and Hygiene: Minimal assistance;Sit to/from stand;Cueing for  sequencing;Cueing for safety               Vision Patient Visual Report: No change from baseline       Perception     Praxis      Pertinent Vitals/Pain Pain Score: 3  Pain Location: R hip Pain Descriptors / Indicators: Aching;Sore Pain Intervention(s): Limited activity within patient's tolerance;Monitored during session     Hand Dominance     Extremity/Trunk Assessment Upper Extremity Assessment Upper Extremity Assessment: Overall WFL for tasks assessed           Communication Communication Communication: No difficulties                 Home Living Family/patient expects to be discharged to:: Private residence Living Arrangements: Spouse/significant other Available Help at Discharge: Family Type of Home: House Home Access: Stairs to enter Secretary/administrator of Steps: 2 Entrance Stairs-Rails: None Home Layout: One level     Bathroom Shower/Tub: Chief Strategy Officer: Standard     Home Equipment: Cane - single point          Prior Functioning/Environment Level of Independence: Independent with assistive device(s);Independent        Comments: Used cane as needed        OT Problem List: Decreased strength;Decreased activity tolerance;Decreased knowledge of use of DME or AE      OT Treatment/Interventions: Self-care/ADL training;Patient/family  education;DME and/or AE instruction    OT Goals(Current goals can be found in the care plan section) Acute Rehab OT Goals Patient Stated Goal: Regain IND OT Goal Formulation: With patient Time For Goal Achievement: 04/29/17  OT Frequency: Min 2X/week    AM-PAC PT "6 Clicks" Daily Activity     Outcome Measure Help from another person eating meals?: None Help from another person taking care of personal grooming?: None Help from another person toileting, which includes using toliet, bedpan, or urinal?: A Little Help from another person bathing (including washing, rinsing, drying)?:  A Little Help from another person to put on and taking off regular upper body clothing?: None Help from another person to put on and taking off regular lower body clothing?: A Little 6 Click Score: 21   End of Session Nurse Communication: Mobility status  Activity Tolerance: Patient tolerated treatment well Patient left: in chair  OT Visit Diagnosis: Unsteadiness on feet (R26.81)                Time: 6962-95281027-1039 OT Time Calculation (min): 12 min Charges:  OT Evaluation $OT Eval Low Complexity: 1 Low G-Codes:     Lise AuerLori Weaver Tweed, OT [phone]  Einar CrowEDDING, Leonilda Cozby D 04/15/2017, 11:14 AM

## 2017-04-15 NOTE — Op Note (Signed)
NAME:  Jeffery White, Jeffery White NO.:  [id number]  MEDICAL RECORD NO.:  [id number]  LOCATION:  PERIO                        FACILITY:  [facility]  PHYSICIAN:  Jeffery White. Jeffery White, M.D.DATE OF BIRTH:  1934/01/29  DATE OF PROCEDURE:  04/14/2017 DATE OF DISCHARGE:                              OPERATIVE REPORT   PREOPERATIVE DIAGNOSES:  Primary osteoarthritis and degenerative joint disease of right hip.  POSTOPERATIVE DIAGNOSES:  Primary osteoarthritis and degenerative joint disease of right hip.  PROCEDURE PERFORMED:  Right total hip arthroplasty through direct anterior approach.  IMPLANTS:  DePuy Sector Gription acetabular component size 60 with two screws, size 36 +0 polyethylene liner, size 13 Corail femoral component with standard offset, and size 36 +8.5 metal hip ball.  ASSISTANT:  Jeffery Canal, PA-C.  ANESTHESIA:  Spinal.  ANTIBIOTICS:  Two grams of IV Ancef.  BLOOD LOSS:  250 mL.  COMPLICATIONS:  None.  INDICATIONS:  Jeffery White is an 84 year old gentleman with debilitating arthritis involving his right hip.  His pain has been daily and it has detrimentally affected his activities of daily living, his quality of life, and his mobility.  He has tried and failed all forms of conservative treatment.  We have recommended total hip arthroplasty.  He understands the risk of acute blood loss anemia, nerve and vessel injury, fracture, infection, dislocation, and DVT.  He understands that our goals are to decrease pain, improve mobility, and overall improve the quality of life.  PROCEDURE DESCRIPTION:  After informed consent was obtained, the appropriate right hip was marked.  He was brought to the operating room, where spinal anesthesia was obtained while he was on the stretcher.  A Foley catheter was placed, and both feet had traction boots applied to them.  Next, he was placed supine on the Hana fracture table with the perineal post in place and both legs in  the in-line skeletal traction devices with no traction applied.  His right operative hip was prepped and draped with DuraPrep and sterile drapes.  A time-out was called to identify the correct patient and correct right hip.  We then made an incision just inferior and posterior to the anterior superior iliac spine, and carried this obliquely down the leg.  We dissected down to tensor fascia lata muscle.  The tensor fascia was then divided longitudinally to proceed with a direct anterior approach to the hip.  We identified and cauterized the circumflex vessels, and then identified the hip capsule.  I opened up the capsule in an L-type format, finding the large joint effusion and significant arthritis throughout his right hip.  The femoral head was devoid of cartilage.  We then made our femoral neck cut with an oscillating saw, and placed a corkscrew guide in the femoral head and removed the femoral head in its entirety.  We then placed a bent Hohmann over the medial acetabular rim and removed remnants of the acetabular labrum and other debris.  I then began reaming under direct visualization from a size 42 reamer, and then we jumped up increments and eventually went all the way to a size 60 with all reamers under direct visualization with the last reamer under direct fluoroscopy,  so that we could obtain our depth of reaming, our inclination, and anteversion.  Once we were pleased with this, we placed the real DePuy Sector Gription acetabular component, size 60 and two screws.  We placed a 36 +0 polyethylene liner for that size of acetabular component.  Attention was then turned to the femur.  With the leg externally rotated to 120 degrees extended and adducted, we were going to place a Mueller retractor medially and a Hohmann retractor behind the greater trochanter.  We released the lateral joint capsule and used a box- cutting osteotome to enter the femoral White and a rongeur  to lateralize.  We then began broaching using a size 8 broach, going up to a size 13.  With the size 13 in place, we trialed a standard offset femoral neck and a 36+ 1.5 hip ball.  We reduced this in the acetabulum, and he needed much more leg length and offset.  We dislocated the hip and removed the trial components.  We replaced the real Corail femoral component size 13 with standard offset and the real 36 +8.5 metal hip ball, and reduced this in the acetabulum and I was pleased with stability, leg length, range of motion, and offset.  We then irrigated the hip with normal saline solution using pulsatile lavage.  WOUND CLOSURE:  We were able to close the joint capsule with interrupted #1 Ethibond suture, followed by running #1 Vicryl in the tensor fascia, 0 Vicryl in the deep tissue, 2-0 Vicryl in the subcutaneous tissue, and interrupted staples on the skin.  Steri-Strips and Xeroform and Aquacel dressing were applied.  CONDITION:  He was taken off the Hana table, and taken to the recovery room in a stable condition.  All final counts were correct.  There were no complications noted.  Of note, Jeffery CanalGilbert Clark, PA-C, assisted in the entire case.  His assistance was crucial for facilitating all aspects of this case.     Jeffery White, M.D.     CYB/MEDQ  D:  04/14/2017  T:  04/15/2017  Job:  [id number]

## 2017-04-16 NOTE — Discharge Instructions (Signed)

## 2017-04-16 NOTE — Progress Notes (Signed)
     Subjective: 2 Days Post-Op Procedure(s) (LRB): RIGHT TOTAL HIP ARTHROPLASTY ANTERIOR APPROACH (Right) Awake, alert and oriented x 4. Up to bathroom today, voiding without difficulty.  Patient reports pain as mild.    Objective:   VITALS:  Temp:  [98 F (36.7 C)-99.4 F (37.4 C)] 99.4 F (37.4 C) (01/27 0626) Pulse Rate:  [68-89] 86 (01/27 0626) Resp:  [15-17] 16 (01/27 0626) BP: (127-160)/(68-84) 129/68 (01/27 0626) SpO2:  [95 %-99 %] 99 % (01/27 0626)  Neurologically intact ABD soft Neurovascular intact Sensation intact distally Intact pulses distally Dorsiflexion/Plantar flexion intact Incision: scant drainage   LABS Recent Labs    04/15/17 0536  HGB 12.1*  WBC 11.7*  PLT 197   Recent Labs    04/15/17 0536  NA 135  K 3.6  CL 100*  CO2 28  BUN 11  CREATININE 0.74  GLUCOSE 120*   No results for input(s): LABPT, INR in the last 72 hours.   Assessment/Plan: 2 Days Post-Op Procedure(s) (LRB): RIGHT TOTAL HIP ARTHROPLASTY ANTERIOR APPROACH (Right)  Advance diet Up with therapy Discharge home with home health  Vira BrownsJames Clover Feehan 04/16/2017, 9:30 AMPatient ID: Quintella BatonJames C Boisselle, male   DOB: 08-23-1933, 84 y.o.   MRN: [id number]

## 2017-04-16 NOTE — Progress Notes (Signed)
Discharge instructions reviewed with patient using teach back method no questions at this time. Patient discharged to home  

## 2017-04-16 NOTE — Progress Notes (Signed)
Occupational Therapy Treatment Patient Details Name: Jeffery White MRN: [id number] DOB: 1933/06/12 Today's Date: 04/16/2017    History of present illness Pt s/p R THR   OT comments  Pt. Has met all OT goals including tub transfer and toileting tasks at S level.  Pt. Also reports pt. Has wife available 24/7 to assist with LB ADLS prn.  Clear for d/c from OT.  OTR/L to sign off.    Follow Up Recommendations  No OT follow up    Equipment Recommendations  3 in 1 bedside commode    Recommendations for Other Services      Precautions / Restrictions Precautions Precautions: Fall Restrictions Weight Bearing Restrictions: No RLE Weight Bearing: Weight bearing as tolerated Other Position/Activity Restrictions: WBAT       Mobility Bed Mobility Overal bed mobility: Modified Independent Bed Mobility: Supine to Sit     Supine to sit: Modified independent (Device/Increase time);HOB elevated     General bed mobility comments: seated in recliner and beg. and end of session  Transfers Overall transfer level: Needs assistance Equipment used: Rolling walker (2 wheeled) Transfers: Sit to/from [facility] Sit to Stand: Supervision Stand pivot transfers: Supervision            Balance Overall balance assessment: No apparent balance deficits (not formally assessed)                                         ADL either performed or assessed with clinical judgement   ADL Overall ADL's : Needs assistance/impaired                       Lower Body Dressing Details (indicate cue type and reason): reports wife will be assisting with LB ADLS Toilet Transfer: Supervision/safety;RW   Toileting- Clothing Manipulation and Hygiene: Supervision/safety;Sit to/from stand   Tub/ Banker: Tub transfer;Supervision/safety;Cueing for sequencing;Rolling walker;Grab bars Tub/Shower Transfer Details (indicate cue type and reason): simulated tub  transfer for R faucet, educated on need for wife to assist with stabalizing the RW Functional mobility during ADLs: Supervision/safety;Rolling walker       Vision       Perception     Praxis      Cognition Arousal/Alertness: Awake/alert Behavior During Therapy: WFL for tasks assessed/performed Overall Cognitive Status: Within Functional Limits for tasks assessed                                          Exercises Total Joint Exercises Ankle Circles/Pumps: AROM;Both;Supine;15 reps Quad Sets: AROM;Both;10 reps;Supine Short Arc Quad: AROM;Right;10 reps;Supine Heel Slides: AAROM;Right;Supine;10 reps Hip ABduction/ADduction: AAROM;Right;15 reps;Supine   Shoulder Instructions       General Comments      Pertinent Vitals/ Pain       Pain Assessment: No/denies pain Pain Score: 3  Pain Location: R hip Pain Descriptors / Indicators: Aching;Sore Pain Intervention(s): Limited activity within patient's tolerance;Monitored during session;Repositioned;Premedicated before session;Ice applied  Home Living                                          Prior Functioning/Environment  Frequency  Min 2X/week        Progress Toward Goals  OT Goals(current goals can now be found in the care plan section)  Progress towards OT goals: Goals met/education completed, patient discharged from OT  Acute Rehab OT Goals Patient Stated Goal: Regain IND, go fishing  Plan Discharge plan remains appropriate    Co-evaluation                 AM-PAC PT "6 Clicks" Daily Activity     Outcome Measure   Help from another person eating meals?: None Help from another person taking care of personal grooming?: None Help from another person toileting, which includes using toliet, bedpan, or urinal?: A Little Help from another person bathing (including washing, rinsing, drying)?: A Little Help from another person to put on and taking off regular  upper body clothing?: None Help from another person to put on and taking off regular lower body clothing?: A Little 6 Click Score: 21    End of Session Equipment Utilized During Treatment: Gait belt;Rolling walker  OT Visit Diagnosis: Unsteadiness on feet (R26.81)   Activity Tolerance Patient tolerated treatment well   Patient Left in chair;with call bell/phone within reach   Nurse Communication Other (comment)(pt. requesting window time frame for d/c so he can tell his wife when to be here)        Time: 1464-3142 OT Time Calculation (min): 10 min  Charges: OT General Charges $OT Visit: 1 Visit OT Treatments $Self Care/Home Management : 8-22 mins   Janice Coffin, COTA/L 04/16/2017, 10:07 AM

## 2017-04-16 NOTE — Progress Notes (Signed)
Physical Therapy Treatment Patient Details Name: Jeffery White MRN: [id number] DOB: Oct 10, 1933 Today's Date: 04/16/2017    History of Present Illness Pt s/p R THR    PT Comments    Pt has met PT goals and is ready to DC home from PT standpoint. He ambulated 400' with RW, no loss of balance. Stair training completed. Reviewed HEP, pt demonstrates good understanding.   Follow Up Recommendations  DC plan and follow up therapy as arranged by surgeon     Equipment Recommendations  Rolling walker with 5" wheels    Recommendations for Other Services       Precautions / Restrictions Precautions Precautions: Fall Restrictions Weight Bearing Restrictions: No RLE Weight Bearing: Weight bearing as tolerated Other Position/Activity Restrictions: WBAT    Mobility  Bed Mobility Overal bed mobility: Modified Independent Bed Mobility: Supine to Sit     Supine to sit: Modified independent (Device/Increase time);HOB elevated     General bed mobility comments: HOB up, used rail  Transfers Overall transfer level: Needs assistance Equipment used: Rolling walker (2 wheeled) Transfers: Sit to/from Stand Sit to Stand: Modified independent (Device/Increase time)            Ambulation/Gait Ambulation/Gait assistance: Supervision Ambulation Distance (Feet): 400 Feet Assistive device: Rolling walker (2 wheeled) Gait Pattern/deviations: Step-to pattern;Step-through pattern;Trunk flexed Gait velocity: decr   General Gait Details: cues for posture, position from RW and sequence, no LOB   Stairs Stairs: Yes   Stair Management: No rails;Backwards;With walker;Step to pattern Number of Stairs: 2 General stair comments: VCs sequencing, min A to manage/steady RW  Wheelchair Mobility    Modified Rankin (Stroke Patients Only)       Balance Overall balance assessment: No apparent balance deficits (not formally assessed)                                          Cognition Arousal/Alertness: Awake/alert Behavior During Therapy: WFL for tasks assessed/performed Overall Cognitive Status: Within Functional Limits for tasks assessed                                        Exercises Total Joint Exercises Ankle Circles/Pumps: AROM;Both;Supine;15 reps Quad Sets: AROM;Both;10 reps;Supine Short Arc Quad: AROM;Right;10 reps;Supine Heel Slides: AAROM;Right;Supine;10 reps Hip ABduction/ADduction: AAROM;Right;15 reps;Supine    General Comments        Pertinent Vitals/Pain Pain Score: 3  Pain Location: R hip Pain Descriptors / Indicators: Aching;Sore Pain Intervention(s): Limited activity within patient's tolerance;Monitored during session;Repositioned;Premedicated before session;Ice applied    Home Living                      Prior Function            PT Goals (current goals can now be found in the care plan section) Acute Rehab PT Goals Patient Stated Goal: Regain IND, go fishing PT Goal Formulation: With patient Time For Goal Achievement: 04/21/17 Potential to Achieve Goals: Good Progress towards PT goals: Progressing toward goals    Frequency    7X/week      PT Plan Current plan remains appropriate    Co-evaluation              AM-PAC PT "6 Clicks" Daily Activity  Outcome Measure  Difficulty turning over in bed (  including adjusting bedclothes, sheets and blankets)?: None Difficulty moving from lying on back to sitting on the side of the bed? : None Difficulty sitting down on and standing up from a chair with arms (e.g., wheelchair, bedside commode, etc,.)?: None Help needed moving to and from a bed to chair (including a wheelchair)?: None Help needed walking in hospital room?: A Little Help needed climbing 3-5 steps with a railing? : A Little 6 Click Score: 22    End of Session Equipment Utilized During Treatment: Gait belt Activity Tolerance: Patient tolerated treatment well Patient left:  in chair;with call bell/phone within reach Nurse Communication: Mobility status PT Visit Diagnosis: Difficulty in walking, not elsewhere classified (R26.2)     Time: 0903-0149 PT Time Calculation (min) (ACUTE ONLY): 36 min  Charges:  $Gait Training: 8-22 mins $Therapeutic Exercise: 8-22 mins                    G Codes:          Philomena Doheny 04/16/2017, 9:38 AM [phone]

## 2017-04-17 NOTE — Discharge Summary (Signed)
Patient ID: Jeffery White MRN: [id number] DOB/AGE: 06/25/1933 84 y.o.  Admit date: 04/14/2017 Discharge date: 04/17/2017  Admission Diagnoses:  Principal Problem:   Unilateral primary osteoarthritis, right hip Active Problems:   Status post total replacement of right hip   Discharge Diagnoses:  Same  Past Medical History:  Diagnosis Date  . Chronic back pain   . Gout   . Hypercholesteremia   . Hypertension     Surgeries: Procedure(s): RIGHT TOTAL HIP ARTHROPLASTY ANTERIOR APPROACH on 04/14/2017   Consultants:   Discharged Condition: Improved  Hospital Course: Jeffery White is an 84 y.o. male who was admitted 04/14/2017 for operative treatment ofUnilateral primary osteoarthritis, right hip. Patient has severe unremitting pain that affects sleep, daily activities, and work/hobbies. After pre-op clearance the patient was taken to the operating room on 04/14/2017 and underwent  Procedure(s): RIGHT TOTAL HIP ARTHROPLASTY ANTERIOR APPROACH.    Patient was given perioperative antibiotics:  Anti-infectives (From admission, onward)   Start     Dose/Rate Route Frequency Ordered Stop   04/14/17 1500  ceFAZolin (ANCEF) IVPB 1 g/50 mL premix     1 g 100 mL/hr over 30 Minutes Intravenous Every 6 hours 04/14/17 1204 04/14/17 2139   04/14/17 0645  ceFAZolin (ANCEF) IVPB 2g/100 mL premix     2 g 200 mL/hr over 30 Minutes Intravenous On call to O.R. 04/14/17 [id number] 04/14/17 0850       Patient was given sequential compression devices, early ambulation, and chemoprophylaxis to prevent DVT.  Patient benefited maximally from hospital stay and there were no complications.    Recent vital signs: No data found.   Recent laboratory studies:  Recent Labs    04/15/17 0536  WBC 11.7*  HGB 12.1*  HCT 36.1*  PLT 197  NA 135  K 3.6  CL 100*  CO2 28  BUN 11  CREATININE 0.74  GLUCOSE 120*  CALCIUM 8.2*     Discharge Medications:   Allergies as of 04/16/2017   No Known Allergies      Medication List    STOP taking these medications   aspirin 81 MG tablet Replaced by:  aspirin 81 MG chewable tablet     TAKE these medications   amLODipine 5 MG tablet Commonly known as:  NORVASC Take 5 mg by mouth daily.   aspirin 81 MG chewable tablet Chew 1 tablet (81 mg total) by mouth 2 (two) times daily. Replaces:  aspirin 81 MG tablet   atenolol 50 MG tablet Commonly known as:  TENORMIN Take 50 mg by mouth daily.   colchicine 0.6 MG tablet Take 1 tablet (0.6 mg total) by mouth daily. 1.2 mg first day then 0.6 mg daily. What changed:    how much to take  additional instructions   fenofibrate 48 MG tablet Commonly known as:  TRICOR Take 48 mg by mouth daily.   gabapentin 300 MG capsule Commonly known as:  NEURONTIN Take 300 mg by mouth 3 (three) times daily as needed (nerve pain).   hydrochlorothiazide 25 MG tablet Commonly known as:  HYDRODIURIL Take 25 mg by mouth daily.   ibuprofen 800 MG tablet Commonly known as:  ADVIL,MOTRIN TAKE 1 TABLET BY MOUTH 3 TIMES A DAY What changed:    how much to take  how to take this  when to take this   losartan 50 MG tablet Commonly known as:  COZAAR Take 50 mg by mouth daily.   oxyCODONE 5 MG immediate release tablet Commonly known as:  ROXICODONE Take 1-2 tablets (5-10 mg total) by mouth every 4 (four) hours as needed for severe pain.   rosuvastatin 10 MG tablet Commonly known as:  CRESTOR Take 5 mg by mouth daily.       Diagnostic Studies: Dg Pelvis Portable  Result Date: 04/14/2017 CLINICAL DATA:  Status post right hip replacement EXAM: PORTABLE PELVIS 1-2 VIEWS COMPARISON:  07/04/2016 FINDINGS: Right hip prosthesis is noted in satisfactory position. No acute bony abnormality is noted. Osteophytic changes are noted along the superior aspect of the acetabulum on the right. IMPRESSION: Status post right hip replacement. Electronically Signed   By: Alcide Clever M.D.   On: 04/14/2017 11:21   Dg C-arm  1-60 Min-no Report  Result Date: 04/14/2017 Fluoroscopy was utilized by the requesting physician.  No radiographic interpretation.   Dg Hip Operative Unilat With Pelvis Right  Result Date: 04/14/2017 CLINICAL DATA:  Right total hip arthroplasty. EXAM: OPERATIVE RIGHT HIP (WITH PELVIS IF PERFORMED) TECHNIQUE: Fluoroscopic spot image(s) were submitted for interpretation post-operatively. COMPARISON:  07/04/2016 FINDINGS: Intraoperative fluoroscopic images from total right hip arthroplasty demonstrate placement of long stem femoral component and screwed in acetabular component. Normal alignment of the orthopedic hardware. No evidence of fracture. Fluoroscopy time is recorded as 43 seconds. IMPRESSION: Intraoperative images from total right hip arthroplasty without evidence of immediate complications. Electronically Signed   By: Ted Mcalpine M.D.   On: 04/14/2017 10:26    Disposition: 01-Home or Self Care  Discharge Instructions    Call MD / Call 911   Complete by:  As directed    If you experience chest pain or shortness of breath, CALL 911 and be transported to the hospital emergency room.  If you develope a fever above 101 F, pus (white drainage) or increased drainage or redness at the wound, or calf pain, call your surgeon's office.   Constipation Prevention   Complete by:  As directed    Drink plenty of fluids.  Prune juice may be helpful.  You may use a stool softener, such as Colace (over the counter) 100 mg twice a day.  Use MiraLax (over the counter) for constipation as needed.   Diet - low sodium heart healthy   Complete by:  As directed    Discharge instructions   Complete by:  As directed    INSTRUCTIONS AFTER JOINT REPLACEMENT   Remove items at home which could result in a fall. This includes throw rugs or furniture in walking pathways ICE to the affected joint every three hours while awake for 30 minutes at a time, for at least the first 3-5 days, and then as needed for pain  and swelling.  Continue to use ice for pain and swelling. You may notice swelling that will progress down to the foot and ankle.  This is normal after surgery.  Elevate your leg when you are not up walking on it.   Continue to use the breathing machine you got in the hospital (incentive spirometer) which will help keep your temperature down.  It is common for your temperature to cycle up and down following surgery, especially at night when you are not up moving around and exerting yourself.  The breathing machine keeps your lungs expanded and your temperature down.   DIET:  As you were doing prior to hospitalization, we recommend a well-balanced diet.  DRESSING / WOUND CARE / SHOWERING  Keep the surgical dressing until follow up.  The dressing is water proof, so you can shower without  any extra covering.  IF THE DRESSING FALLS OFF or the wound gets wet inside, change the dressing with sterile gauze.  Please use good hand washing techniques before changing the dressing.  Do not use any lotions or creams on the incision until instructed by your surgeon.    ACTIVITY  Increase activity slowly as tolerated, but follow the weight bearing instructions below.   No driving for 6 weeks or until further direction given by your physician.  You cannot drive while taking narcotics.  No lifting or carrying greater than 10 lbs. until further directed by your surgeon. Avoid periods of inactivity such as sitting longer than an hour when not asleep. This helps prevent blood clots.  You may return to work once you are authorized by your doctor.     WEIGHT BEARING   Weight bearing as tolerated with assist device (walker, cane, etc) as directed, use it as long as suggested by your surgeon or therapist, typically at least 4-6 weeks.   EXERCISES  Results after joint replacement surgery are often greatly improved when you follow the exercise, range of motion and muscle strengthening exercises prescribed by your  doctor. Safety measures are also important to protect the joint from further injury. Any time any of these exercises cause you to have increased pain or swelling, decrease what you are doing until you are comfortable again and then slowly increase them. If you have problems or questions, call your caregiver or physical therapist for advice.   Rehabilitation is important following a joint replacement. After just a few days of immobilization, the muscles of the leg can become weakened and shrink (atrophy).  These exercises are designed to build up the tone and strength of the thigh and leg muscles and to improve motion. Often times heat used for twenty to thirty minutes before working out will loosen up your tissues and help with improving the range of motion but do not use heat for the first two weeks following surgery (sometimes heat can increase post-operative swelling).   These exercises can be done on a training (exercise) mat, on the floor, on a table or on a bed. Use whatever works the best and is most comfortable for you.    Use music or television while you are exercising so that the exercises are a pleasant break in your day. This will make your life better with the exercises acting as a break in your routine that you can look forward to.   Perform all exercises about fifteen times, three times per day or as directed.  You should exercise both the operative leg and the other leg as well.  Exercises include:   Quad Sets - Tighten up the muscle on the front of the thigh (Quad) and hold for 5-10 seconds.   Straight Leg Raises - With your knee straight (if you were given a brace, keep it on), lift the leg to 60 degrees, hold for 3 seconds, and slowly lower the leg.  Perform this exercise against resistance later as your leg gets stronger.  Leg Slides: Lying on your back, slowly slide your foot toward your buttocks, bending your knee up off the floor (only go as far as is comfortable). Then slowly slide  your foot back down until your leg is flat on the floor again.  Angel Wings: Lying on your back spread your legs to the side as far apart as you can without causing discomfort.  Hamstring Strength:  Lying on your back, push your  heel against the floor with your leg straight by tightening up the muscles of your buttocks.  Repeat, but this time bend your knee to a comfortable angle, and push your heel against the floor.  You may put a pillow under the heel to make it more comfortable if necessary.   A rehabilitation program following joint replacement surgery can speed recovery and prevent re-injury in the future due to weakened muscles. Contact your doctor or a physical therapist for more information on knee rehabilitation.    CONSTIPATION  Constipation is defined medically as fewer than three stools per week and severe constipation as less than one stool per week.  Even if you have a regular bowel pattern at home, your normal regimen is likely to be disrupted due to multiple reasons following surgery.  Combination of anesthesia, postoperative narcotics, change in appetite and fluid intake all can affect your bowels.   YOU MUST use at least one of the following options; they are listed in order of increasing strength to get the job done.  They are all available over the counter, and you may need to use some, POSSIBLY even all of these options:    Drink plenty of fluids (prune juice may be helpful) and high fiber foods Colace 100 mg by mouth twice a day  Senokot for constipation as directed and as needed Dulcolax (bisacodyl), take with full glass of water  Miralax (polyethylene glycol) once or twice a day as needed.  If you have tried all these things and are unable to have a bowel movement in the first 3-4 days after surgery call either your surgeon or your primary doctor.    If you experience loose stools or diarrhea, hold the medications until you stool forms back up.  If your symptoms do not  get better within 1 week or if they get worse, check with your doctor.  If you experience "the worst abdominal pain ever" or develop nausea or vomiting, please contact the office immediately for further recommendations for treatment.   ITCHING:  If you experience itching with your medications, try taking only a single pain pill, or even half a pain pill at a time.  You can also use Benadryl over the counter for itching or also to help with sleep.   TED HOSE STOCKINGS:  Use stockings on both legs until for at least 2 weeks or as directed by physician office. They may be removed at night for sleeping.  MEDICATIONS:  See your medication summary on the "After Visit Summary" that nursing will review with you.  You may have some home medications which will be placed on hold until you complete the course of blood thinner medication.  It is important for you to complete the blood thinner medication as prescribed.  PRECAUTIONS:  If you experience chest pain or shortness of breath - call 911 immediately for transfer to the hospital emergency department.   If you develop a fever greater that 101 F, purulent drainage from wound, increased redness or drainage from wound, foul odor from the wound/dressing, or calf pain - CONTACT YOUR SURGEON.                                                   FOLLOW-UP APPOINTMENTS:  If you do not already have a post-op appointment, please call the office for an  appointment to be seen by your surgeon.  Guidelines for how soon to be seen are listed in your "After Visit Summary", but are typically between 1-4 weeks after surgery.  OTHER INSTRUCTIONS:   Knee Replacement:  Do not place pillow under knee, focus on keeping the knee straight while resting. CPM instructions: 0-90 degrees, 2 hours in the morning, 2 hours in the afternoon, and 2 hours in the evening. Place foam block, curve side up under heel at all times except when in CPM or when walking.  DO NOT modify, tear, cut, or  change the foam block in any way.  MAKE SURE YOU:  Understand these instructions.  Get help right away if you are not doing well or get worse.    Thank you for letting us be a part of your medical care team.  It is a privilege we respect greatly.  We hope these instructions will help you stay on track for a fast and full recovery! .   Driving restrictions   Complete by:  As directed    No driving for 4 weeks   Increase activity slowly as tolerated   Complete by:  As directed    Lifting restrictions   Complete by:  As directed    No lifting for 8 weeks      Follow-up Information    Kathryne HitchBlackman, Christopher Y, MD Follow up in 2 week(s).   Specialty:  Orthopedic Surgery Contact information: [address] [place] [address] [phone]        [facility] Follow up.   Specialty:  Home Health Services Why:  Home Health Physical Therapy-agency will call to arrange initial appointment Contact information: [address] [place] [address] [place] [address] [phone]            Signed: Kathryne HitchChristopher Y Blackman 04/17/2017, 5:29 PM

## 2017-04-19 ENCOUNTER — Telehealth (INDEPENDENT_AMBULATORY_CARE_PROVIDER_SITE_OTHER): Payer: Self-pay

## 2017-04-19 NOTE — Telephone Encounter (Signed)
Verbal orders given  

## 2017-04-19 NOTE — Telephone Encounter (Signed)
Katelyn from [facility] called and would like verbal orders for patient  3 times a week for 1 week and then 2 times a week for 1 week.  CB [phone]

## 2017-04-21 ENCOUNTER — Other Ambulatory Visit (INDEPENDENT_AMBULATORY_CARE_PROVIDER_SITE_OTHER): Payer: Self-pay | Admitting: Specialist

## 2017-04-21 NOTE — Telephone Encounter (Signed)
[name] has recently had hip surgery and the ibuprofen use should be decided by Dr. Magnus IvanBlackman as it may inhibit bone ingrowth into the prosthesis.

## 2017-04-21 NOTE — Telephone Encounter (Signed)
Ibuprofen refill request.

## 2017-04-21 NOTE — Telephone Encounter (Signed)
I'm ok with ibuprofen on him

## 2017-04-26 ENCOUNTER — Other Ambulatory Visit (INDEPENDENT_AMBULATORY_CARE_PROVIDER_SITE_OTHER): Payer: Self-pay | Admitting: Orthopaedic Surgery

## 2017-04-26 ENCOUNTER — Telehealth (INDEPENDENT_AMBULATORY_CARE_PROVIDER_SITE_OTHER): Payer: Self-pay | Admitting: Orthopaedic Surgery

## 2017-04-26 MED ORDER — OXYCODONE HCL 5 MG PO TABS: 5.0000 mg | ORAL_TABLET | Freq: Four times a day (QID) | ORAL | 0 refills | Status: DC | PRN

## 2017-04-26 NOTE — Telephone Encounter (Signed)
Patient left vm requesting pain medication, says he has been taking OTC medicine and its not helping. Please advise # [phone]

## 2017-04-26 NOTE — Telephone Encounter (Signed)
Please advise 

## 2017-04-26 NOTE — Telephone Encounter (Signed)
He is postop from a total hip arthroplasty.  Was just done sometime within coming for a prescription for more oxycodone.

## 2017-04-26 NOTE — Telephone Encounter (Signed)
Patient aware Rx ready at front desk  

## 2017-05-01 ENCOUNTER — Ambulatory Visit (INDEPENDENT_AMBULATORY_CARE_PROVIDER_SITE_OTHER): Payer: Medicare Other | Admitting: Physician Assistant

## 2017-05-01 ENCOUNTER — Encounter (INDEPENDENT_AMBULATORY_CARE_PROVIDER_SITE_OTHER): Payer: Self-pay | Admitting: Orthopaedic Surgery

## 2017-05-01 DIAGNOSIS — Z96641 Presence of right artificial hip joint: Secondary | ICD-10-CM

## 2017-05-01 NOTE — Progress Notes (Signed)
HPI: [name] returns today 17 days status post right total hip arthroplasty.  He states overall he is doing great.  He states that hip is much improved compared to preop.  He is on aspirin 81 mg 1 daily.  Denies any fevers chills shortness of breath chest pain or calf pain.  Is on oxycodone for pain.  He is ambulating with a cane has been discharged from in-home therapy.  Review of systems: Please see HPI otherwise negative  Physical exam: General well-developed well-nourished male in no acute distress made affect appropriate.  Right hip: Surgical incision is healing well no signs of infection.  Right calf supple nontender dorsiflexion plantarflexion ankle intact.  He ambulates with a cane.  Impression: Status post right total hip arthroplasty  Plan: He will remain on aspirin 81 mg once daily for another week and then discontinue.  Continue to do the home exercise program as taught by therapy.  Scar tissue mobilization encouraged.  He will follow-up with us in 1 month sooner if there is any questions or concerns or signs of infection.

## 2017-05-05 ENCOUNTER — Telehealth (INDEPENDENT_AMBULATORY_CARE_PROVIDER_SITE_OTHER): Payer: Self-pay | Admitting: Orthopaedic Surgery

## 2017-05-05 NOTE — Telephone Encounter (Signed)
Rx refill Oxycodone °

## 2017-05-08 MED ORDER — OXYCODONE HCL 5 MG PO TABS: 5.0000 mg | ORAL_TABLET | Freq: Four times a day (QID) | ORAL | 0 refills | Status: DC | PRN

## 2017-05-08 NOTE — Telephone Encounter (Signed)
Patient aware Rx ready at front desk  

## 2017-05-08 NOTE — Telephone Encounter (Signed)
please advise.

## 2017-05-08 NOTE — Telephone Encounter (Signed)
Can come and pick up script.  Will need to ween to hydrocodone after this one.

## 2017-05-16 ENCOUNTER — Telehealth (INDEPENDENT_AMBULATORY_CARE_PROVIDER_SITE_OTHER): Payer: Self-pay | Admitting: Orthopaedic Surgery

## 2017-05-16 MED ORDER — OXYCODONE HCL 5 MG PO TABS: 5.0000 mg | ORAL_TABLET | Freq: Three times a day (TID) | ORAL | 0 refills | Status: DC | PRN

## 2017-05-16 NOTE — Telephone Encounter (Signed)
Patient called requesting an RX refill on his hydrocodone.  CB#[id number].  Thank you.

## 2017-05-16 NOTE — Telephone Encounter (Signed)
Patient wants refill of oxycodone

## 2017-05-16 NOTE — Telephone Encounter (Signed)
I sent it in.  Tell him to try to ween from this standpoint because this is the last oxycodone I can give him.

## 2017-05-16 NOTE — Telephone Encounter (Signed)
Patient aware of the below message  

## 2017-05-29 ENCOUNTER — Ambulatory Visit (INDEPENDENT_AMBULATORY_CARE_PROVIDER_SITE_OTHER): Payer: Medicare Other | Admitting: Physician Assistant

## 2017-05-29 ENCOUNTER — Encounter (INDEPENDENT_AMBULATORY_CARE_PROVIDER_SITE_OTHER): Payer: Self-pay | Admitting: Physician Assistant

## 2017-05-29 VITALS — Ht 72.0 in | Wt 257.0 lb

## 2017-05-29 DIAGNOSIS — Z96641 Presence of right artificial hip joint: Secondary | ICD-10-CM

## 2017-05-29 NOTE — Progress Notes (Signed)
Office Visit Note   Patient: Jeffery White           Date of Birth: 12-Mar-1934           MRN: [id number] Visit Date: 05/29/2017              Requested by: Georgann HousekeeperHusain, Karrar, MD [address] [place], [place] [address] PCP: Georgann HousekeeperHusain, Karrar, MD   Assessment & Plan: Visit Diagnoses:  1. Status post total replacement of right hip     Plan: IT band stretching exercises shown and had patient demonstrate.  We will perform these.  He will discuss with his primary care physician getting into a pain management clinic .  Is felt that it would be best to his primary care physician handling his pain medicines until he can find a pain clinic.  See him back in 6 months postop no films at that time unless clinically indicated.  Did discuss with him that he needs to follow-up sooner due to any concerns or questions as to make an earlier appointment.  Follow-Up Instructions: Return in about 4 months (around 09/28/2017).   Orders:  No orders of the defined types were placed in this encounter.  No orders of the defined types were placed in this encounter.     Procedures: No procedures performed   Clinical Data: No additional findings.   Subjective: Chief Complaint  Patient presents with  . Right Hip - Routine Post Op    HPI Jeffery White returns today 45 days status post right total hip arthroplasty.  States overall the right hip is doing well.  He has some pain on the lateral aspect of the hip.  He ambulates with a cane.  He unfortunately has had to change pain management clinics due to his insurance plan and is actually not in a pain management clinic at this point time.  He reports that he is talking with his primary care physician about this and the need to find a new pain management clinic.  States overall that his right hip is doing well and is trending towards improvement. Review of Systems No fevers, chills . See HPI.   Objective: Vital Signs: Ht 6' (1.829 m)   Wt 257 lb  (116.6 kg)   BMI 34.86 kg/m   Physical Exam  Constitutional: He is oriented to person, place, and time. He appears well-developed and well-nourished. No distress.  Neurological: He is alert and oriented to person, place, and time.  Skin: He is not diaphoretic.  Psychiatric: He has a normal mood and affect.    Ortho Exam Right hip full range of motion without significant pain.  Right calf supple nontender.  Surgical incisions healed well no signs of infection.  Tenderness of the right greater trochanteric region. Specialty Comments:  No specialty comments available.  Imaging: No results found.   PMFS History: Patient Active Problem List   Diagnosis Date Noted  . 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 Unilateral primary osteoarthritis, right hip 03/06/2017   Past Medical History:  Diagnosis Date  . Chronic back pain   . Gout   . Hypercholesteremia   . Hypertension     No family history on file.  Past Surgical History:  Procedure Laterality Date  . COLONOSCOPY    . TOTAL HIP ARTHROPLASTY Right 04/14/2017   Procedure: RIGHT TOTAL HIP ARTHROPLASTY ANTERIOR APPROACH;  Surgeon: Kathryne HitchBlackman, Christopher Y, MD;  Location: [facility];  Service: Orthopedics;  Laterality: Right;  Social History   Occupational History  . Not on file  Tobacco Use  . Smoking status: Never Smoker  . Smokeless tobacco: Never Used  Substance and Sexual Activity  . Alcohol use: No  . Drug use: No  . Sexual activity: Not on file       

## 2017-06-18 ENCOUNTER — Other Ambulatory Visit (INDEPENDENT_AMBULATORY_CARE_PROVIDER_SITE_OTHER): Payer: Self-pay | Admitting: Orthopaedic Surgery

## 2017-06-19 NOTE — Telephone Encounter (Signed)
Continue

## 2017-09-24 ENCOUNTER — Other Ambulatory Visit (INDEPENDENT_AMBULATORY_CARE_PROVIDER_SITE_OTHER): Payer: Self-pay | Admitting: Specialist

## 2017-09-24 DIAGNOSIS — M160 Bilateral primary osteoarthritis of hip: Secondary | ICD-10-CM

## 2017-09-24 DIAGNOSIS — M47812 Spondylosis without myelopathy or radiculopathy, cervical region: Secondary | ICD-10-CM

## 2017-09-24 DIAGNOSIS — M170 Bilateral primary osteoarthritis of knee: Secondary | ICD-10-CM

## 2017-09-24 DIAGNOSIS — M25551 Pain in right hip: Secondary | ICD-10-CM

## 2017-09-25 NOTE — Telephone Encounter (Signed)
meloxicam refill request 

## 2017-10-02 ENCOUNTER — Ambulatory Visit (INDEPENDENT_AMBULATORY_CARE_PROVIDER_SITE_OTHER): Payer: Medicare Other | Admitting: Physician Assistant

## 2017-10-02 ENCOUNTER — Encounter (INDEPENDENT_AMBULATORY_CARE_PROVIDER_SITE_OTHER): Payer: Self-pay | Admitting: Physician Assistant

## 2017-10-02 DIAGNOSIS — Z96641 Presence of right artificial hip joint: Secondary | ICD-10-CM | POA: Diagnosis not present

## 2017-10-02 NOTE — Progress Notes (Signed)
HPI: Jeffery White returns today 6 months status post right total hip arthroplasty.  He is no longer have any pain in the hip.  He states he has some discomfort in the in the thigh region if he overdoes things.  He denies any numbness or tingling in the right thigh.   Review of systems: No physical exam calf pain or shortness of breath.  Physical exam: Right hip range of motion without pain.  Walks without any assistive device.  Surgical incisions healing well no signs of infection.  Right calf supple and nontender.  Impression: 6 months status post right total hip arthroplasty plan: We will see him back in 6 months at that time we will obtain an AP pelvis and lateral view of his right hip.  He will follow-up on an earlier basis if he has any questions or concerns.

## 2018-04-09 ENCOUNTER — Ambulatory Visit (INDEPENDENT_AMBULATORY_CARE_PROVIDER_SITE_OTHER): Payer: Medicare Other | Admitting: Physician Assistant

## 2019-01-17 ENCOUNTER — Ambulatory Visit: Payer: Self-pay

## 2019-01-17 ENCOUNTER — Other Ambulatory Visit: Payer: Self-pay

## 2019-01-17 ENCOUNTER — Encounter: Payer: Self-pay | Admitting: Specialist

## 2019-01-17 ENCOUNTER — Ambulatory Visit (INDEPENDENT_AMBULATORY_CARE_PROVIDER_SITE_OTHER): Payer: Medicare Other | Admitting: Specialist

## 2019-01-17 VITALS — BP 151/87 | HR 75 | Ht 72.0 in | Wt 248.0 lb

## 2019-01-17 DIAGNOSIS — M19011 Primary osteoarthritis, right shoulder: Secondary | ICD-10-CM | POA: Diagnosis not present

## 2019-01-17 DIAGNOSIS — Z96641 Presence of right artificial hip joint: Secondary | ICD-10-CM

## 2019-01-17 DIAGNOSIS — M47812 Spondylosis without myelopathy or radiculopathy, cervical region: Secondary | ICD-10-CM | POA: Diagnosis not present

## 2019-01-17 DIAGNOSIS — M542 Cervicalgia: Secondary | ICD-10-CM

## 2019-01-17 MED ORDER — DICLOFENAC SODIUM 1 % TD GEL: 2.0000 g | Freq: Four times a day (QID) | TRANSDERMAL | 1 refills | Status: DC

## 2019-01-17 NOTE — Progress Notes (Signed)
Office Visit Note   Patient: Jeffery White           Date of Birth: 10/09/1933           MRN: [id number] Visit Date: 01/17/2019              Requested by: Wenda Low, MD [address] [place],  [place] [address] PCP: Wenda Low, MD   Assessment & Plan: Visit Diagnoses:  1. Cervicalgia     Plan: Avoid overhead lifting and overhead use of the arms. Do not lift greater than 10 lbs. Tylenol ES one every 6-8 hours for pain and inflamation. Call if you are having right shoulder pain and need to consider a cortisone injection into the right shoulder. Home exercise program. Voltaren gel  1% APPLY To the right AC joint TID or QID. Shoulder Exercises Ask your health care provider which exercises are safe for you. Do exercises exactly as told by your health care provider and adjust them as directed. It is normal to feel mild stretching, pulling, tightness, or discomfort as you do these exercises. Stop right away if you feel sudden pain or your pain gets worse. Do not begin these exercises until told by your health care provider. Stretching exercises External rotation and abduction This exercise is sometimes called corner stretch. This exercise rotates your arm outward (external rotation) and moves your arm out from your body (abduction). 1. Stand in a doorway with one of your feet slightly in front of the other. This is called a staggered stance. If you cannot reach your forearms to the door frame, stand facing a corner of a room. 2. Choose one of the following positions as told by your health care provider: ? Place your hands and forearms on the door frame above your head. ? Place your hands and forearms on the door frame at the height of your head. ? Place your hands on the door frame at the height of your elbows. 3. Slowly move your weight onto your front foot until you feel a stretch across your chest and in the front of your shoulders. Keep your head and chest  upright and keep your abdominal muscles tight. 4. Hold for __15________ seconds. 5. To release the stretch, shift your weight to your back foot. Repeat _____10_____ times. Complete this exercise ___2_______ times a day. Extension, standing 1. Stand and hold a broomstick, a cane, or a similar object behind your back. ? Your hands should be a little wider than shoulder width apart. ? Your palms should face away from your back. 2. Keeping your elbows straight and your shoulder muscles relaxed, move the stick away from your body until you feel a stretch in your shoulders (extension). ? Avoid shrugging your shoulders while you move the stick. Keep your shoulder blades tucked down toward the middle of your back. 3. Hold for __15________ seconds. 4. Slowly return to the starting position. Repeat ____10______ times. Complete this exercise ____2______ times a day. Range-of-motion exercises Pendulum  1. Stand near a wall or a surface that you can hold onto for balance. 2. Bend at the waist and let your left / right arm hang straight down. Use your other arm to support you. Keep your back straight and do not lock your knees. 3. Relax your left / right arm and shoulder muscles, and move your hips and your trunk so your left / right arm swings freely. Your arm should swing because of the motion of your  body, not because you are using your arm or shoulder muscles. 4. Keep moving your hips and trunk so your arm swings in the following directions, as told by your health care provider: ? Side to side. ? Forward and backward. ? In clockwise and counterclockwise circles. 5. Continue each motion for ______15____ seconds, or for as long as told by your health care provider. 6. Slowly return to the starting position. Repeat ___10_______ times. Complete this exercise _______2___ times a day. Shoulder flexion, standing  1. Stand and hold a broomstick, a cane, or a similar object. Place your hands a little more  than shoulder width apart on the object. Your left / right hand should be palm up, and your other hand should be palm down. 2. Keep your elbow straight and your shoulder muscles relaxed. Push the stick up with your healthy arm to raise your left / right arm in front of your body, and then over your head until you feel a stretch in your shoulder (flexion). ? Avoid shrugging your shoulder while you raise your arm. Keep your shoulder blade tucked down toward the middle of your back. 3. Hold for ____15_____ seconds. 4. Slowly return to the starting position. Repeat __10________ times. Complete this exercise ____2______ times a day. Shoulder abduction, standing 1. Stand and hold a broomstick, a cane, or a similar object. Place your hands a little more than shoulder width apart on the object. Your left / right hand should be palm up, and your other hand should be palm down. 2. Keep your elbow straight and your shoulder muscles relaxed. Push the object across your body toward your left / right side. Raise your left / right arm to the side of your body (abduction) until you feel a stretch in your shoulder. ? Do not raise your arm above shoulder height unless your health care provider tells you to do that. ? If directed, raise your arm over your head. ? Avoid shrugging your shoulder while you raise your arm. Keep your shoulder blade tucked down toward the middle of your back. 3. Hold for __15________ seconds. 4. Slowly return to the starting position. Repeat ____10______ times. Complete this exercise _____2_____ times a day. Internal rotation  1. Place your left / right hand behind your back, palm up. 2. Use your other hand to dangle an exercise band, a towel, or a similar object over your shoulder. Grasp the band with your left / right hand so you are holding on to both ends. 3. Gently pull up on the band until you feel a stretch in the front of your left / right shoulder. The movement of your arm toward  the center of your body is called internal rotation. ? Avoid shrugging your shoulder while you raise your arm. Keep your shoulder blade tucked down toward the middle of your back. 4. Hold for _____15_____ seconds. 5. Release the stretch by letting go of the band and lowering your hands. Repeat _____10_____ times. Complete this exercise _____2External rotation  1. Sit in a stable chair without armrests. 2. Secure an exercise band to a stable object at elbow height on your left / right side. 3. Place a soft object, such as a folded towel or a small pillow, between your left / right upper arm and your body to move your elbow about 4 inches (10 cm) away from your side. 4. Hold the end of the exercise band so it is tight and there is no slack. 5. Keeping your elbow pressed  against the soft object, slowly move your forearm out, away from your abdomen (external rotation). Keep your body steady so only your forearm moves. 6. Hold for __________ seconds. 7. Slowly return to the starting position. Repeat __________ times. Complete this exercise __________ times a day. Shoulder abduction  1. Sit in a stable chair without armrests, or stand up. 2. Hold a __________ weight in your left / right hand, or hold an exercise band with both hands. 3. Start with your arms straight down and your left / right palm facing in, toward your body. 4. Slowly lift your left / right hand out to your side (abduction). Do not lift your hand above shoulder height unless your health care provider tells you that this is safe. ? Keep your arms straight. ? Avoid shrugging your shoulder while you do this movement. Keep your shoulder blade tucked down toward the middle of your back. 5. Hold for __________ seconds. 6. Slowly lower your arm, and return to the starting position. Repeat __________ times. Complete this exercise __________ times a day. Shoulder extension 1. Sit in a stable chair without armrests, or stand up. 2.  Secure an exercise band to a stable object in front of you so it is at shoulder height. 3. Hold one end of the exercise band in each hand. Your palms should face each other. 4. Straighten your elbows and lift your hands up to shoulder height. 5. Step back, away from the secured end of the exercise band, until the band is tight and there is no slack. 6. Squeeze your shoulder blades together as you pull your hands down to the sides of your thighs (extension). Stop when your hands are straight down by your sides. Do not let your hands go behind your body. 7. Hold for ____15______ seconds. 8. Slowly return to the starting position. Repeat ___10______ times. Complete this exercise ____2______ times a day. Shoulder row 1. Sit in a stable chair without armrests, or stand up. 2. Secure an exercise band to a stable object in front of you so it is at waist height. 3. Hold one end of the exercise band in each hand. Position your palms so that your thumbs are facing the ceiling (neutral position). 4. Bend each of your elbows to a 90-degree angle (right angle) and keep your upper arms at your sides. 5. Step back until the band is tight and there is no slack. 6. Slowly pull your elbows back behind you. 7. Hold for ____15______ seconds. 8. Slowly return to the starting position. Repeat ____10______ times. Complete this exercise ______2____ times a day. Shoulder press-ups  1. Sit in a stable chair that has armrests. Sit upright, with your feet flat on the floor. 2. Put your hands on the armrests so your elbows are bent and your fingers are pointing forward. Your hands should be about even with the sides of your body. 3. Push down on the armrests and use your arms to lift yourself off the chair. Straighten your elbows and lift yourself up as much as you comfortably can. ? Move your shoulder blades down, and avoid letting your shoulders move up toward your ears. ? Keep your feet on the ground. As you get  stronger, your feet should support less of your body weight as you lift yourself up. 4. Hold for ____15______ seconds. 5. Slowly lower yourself back into the chair. Repeat ___10_______ times. Complete this exercise __2________ times a day. Wall push-ups  1. Stand so you are facing a  stable wall. Your feet should be about one arm-length away from the wall. 2. Lean forward and place your palms on the wall at shoulder height. 3. Keep your feet flat on the floor as you bend your elbows and lean forward toward the wall. 4. Hold for ___15_______ seconds. 5. Straighten your elbows to push yourself back to the starting position. Repeat ____10______ times. Complete this exercise _____2_____ times a day. This information is not intended to replace advice given to you by your health care provider. Make sure you discuss any questions you have with your health care provider. Document Released: 01/19/2005 Document Revised: 06/29/2018 Document Reviewed: 04/06/2018 Elsevier Patient Education  2020 [employer].  Follow-Up Instructions: No follow-ups on file.   Orders:  Orders Placed This Encounter  Procedures  . XR Cervical Spine 2 or 3 views   No orders of the defined types were placed in this encounter.     Procedures: No procedures performed   Clinical Data: No additional findings.   Subjective: Chief Complaint  Patient presents with  . Neck - Follow-up    85 year old male right handed with history of cervical spondylosis and lumbar DDD. He is taking pain medications intermittantly. Had right hip arthroplasty about 22 months ago. The right hip replacement relieved a significant amount of the arthritis. He is in [facility] management at [facility], with Dr. Willy Eddy. He has had some tests done by Dr. Lucretia Field at [place], [place] only shows the reports from just before his hip surgery in 03/2017.    Review of Systems  Constitutional: Negative.   HENT: Negative.   Eyes: Negative.    Respiratory: Negative.   Cardiovascular: Negative.   Gastrointestinal: Negative.   Endocrine: Negative.   Genitourinary: Negative.   Musculoskeletal: Positive for neck pain and neck stiffness.  Skin: Negative.  Negative for color change, pallor, rash and wound.  Allergic/Immunologic: Negative.   Neurological: Negative.  Negative for dizziness, tremors, seizures, syncope, facial asymmetry, speech difficulty, weakness, light-headedness, numbness and headaches.  Hematological: Negative.   Psychiatric/Behavioral: Negative.  Negative for agitation, behavioral problems, confusion, decreased concentration, dysphoric mood, hallucinations, self-injury, sleep disturbance and suicidal ideas. The patient is not nervous/anxious and is not hyperactive.      Objective: Vital Signs: BP (!) 151/87 (BP Location: Left Arm, Patient Position: Sitting)   Pulse 75   Ht 6' (1.829 m)   Wt 248 lb (112.5 kg)   BMI 33.63 kg/m   Physical Exam Constitutional:      Appearance: He is well-developed.  HENT:     Head: Normocephalic and atraumatic.  Eyes:     Pupils: Pupils are equal, round, and reactive to light.  Neck:     Musculoskeletal: Normal range of motion and neck supple.  Pulmonary:     Effort: Pulmonary effort is normal.     Breath sounds: Normal breath sounds.  Abdominal:     General: Bowel sounds are normal.     Palpations: Abdomen is soft.  Skin:    General: Skin is warm and dry.  Neurological:     Mental Status: He is alert and oriented to person, place, and time.  Psychiatric:        Behavior: Behavior normal.        Thought Content: Thought content normal.        Judgment: Judgment normal.     Right Shoulder Exam   Tenderness  The patient is experiencing tenderness in the acromion, acromioclavicular joint and biceps tendon.  Range of Motion  Active abduction: abnormal  Passive abduction: abnormal  Extension: normal  External rotation: normal  Forward flexion: normal   Internal rotation 0 degrees: normal  Internal rotation 90 degrees: normal   Muscle Strength  Abduction: 4/5  Internal rotation: 5/5  External rotation: 5/5  Supraspinatus: 5/5  Subscapularis: 5/5  Biceps: 5/5   Tests  Impingement: negative  Other  Erythema: absent Scars: absent Sensation: normal Pulse: present   Left Shoulder Exam   Tenderness  The patient is experiencing tenderness in the acromion, acromioclavicular joint and biceps tendon.  Range of Motion  Active abduction: abnormal  Passive abduction: abnormal  Extension: normal  External rotation: normal  Forward flexion: normal  Internal rotation 0 degrees: normal  Internal rotation 90 degrees: normal   Muscle Strength  Internal rotation: 5/5  External rotation: 5/5  Supraspinatus: 5/5  Subscapularis: 5/5  Biceps: 5/5   Tests  Apprehension: negative Impingement: negative  Other  Erythema: absent Scars: absent Sensation: normal Pulse: present       Specialty Comments:  No specialty comments available.  Imaging: No results found.   PMFS History: Patient Active Problem List   Diagnosis Date Noted  . 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 Unilateral primary osteoarthritis, right hip 03/06/2017   Past Medical History:  Diagnosis Date  . Chronic back pain   . Gout   . Hypercholesteremia   . Hypertension     History reviewed. No pertinent family history.  Past Surgical History:  Procedure Laterality Date  . COLONOSCOPY    . TOTAL HIP ARTHROPLASTY Right 04/14/2017   Procedure: RIGHT TOTAL HIP ARTHROPLASTY ANTERIOR APPROACH;  Surgeon: Kathryne Hitch, MD;  Location: [facility];  Service: Orthopedics;  Laterality: Right;   Social History   Occupational History  . Not on file  Tobacco Use  . Smoking status: Never Smoker  . Smokeless tobacco: Never Used  Substance and Sexual Activity  . Alcohol use: No  . Drug use: No  . Sexual activity: Not on file

## 2019-01-17 NOTE — Patient Instructions (Signed)
Avoid overhead lifting and overhead use of the arms. D Shoulder Exercises Ask your health care provider which exercises are safe for you. Do exercises exactly as told by your health care provider and adjust them as directed. It is normal to feel mild stretching, pulling, tightness, or discomfort as you do these exercises. Stop right away if you feel sudden pain or your pain gets worse. Do not begin these exercises until told by your health care provider. Stretching exercises External rotation and abduction This exercise is sometimes called corner stretch. This exercise rotates your arm outward (external rotation) and moves your arm out from your body (abduction). 1. Stand in a doorway with one of your feet slightly in front of the other. This is called a staggered stance. If you cannot reach your forearms to the door frame, stand facing a corner of a room. 2. Choose one of the following positions as told by your health care provider: ? Place your hands and forearms on the door frame above your head. ? Place your hands and forearms on the door frame at the height of your head. ? Place your hands on the door frame at the height of your elbows. 3. Slowly move your weight onto your front foot until you feel a stretch across your chest and in the front of your shoulders. Keep your head and chest upright and keep your abdominal muscles tight. 4. Hold for __15________ seconds. 5. To release the stretch, shift your weight to your back foot. Repeat _____10_____ times. Complete this exercise ___2_______ times a day. Extension, standing 1. Stand and hold a broomstick, a cane, or a similar object behind your back. ? Your hands should be a little wider than shoulder width apart. ? Your palms should face away from your back. 2. Keeping your elbows straight and your shoulder muscles relaxed, move the stick away from your body until you feel a stretch in your shoulders (extension). ? Avoid shrugging your  shoulders while you move the stick. Keep your shoulder blades tucked down toward the middle of your back. 3. Hold for __15________ seconds. 4. Slowly return to the starting position. Repeat ____10______ times. Complete this exercise ____2______ times a day. Range-of-motion exercises Pendulum  1. Stand near a wall or a surface that you can hold onto for balance. 2. Bend at the waist and let your left / right arm hang straight down. Use your other arm to support you. Keep your back straight and do not lock your knees. 3. Relax your left / right arm and shoulder muscles, and move your hips and your trunk so your left / right arm swings freely. Your arm should swing because of the motion of your body, not because you are using your arm or shoulder muscles. 4. Keep moving your hips and trunk so your arm swings in the following directions, as told by your health care provider: ? Side to side. ? Forward and backward. ? In clockwise and counterclockwise circles. 5. Continue each motion for ______15____ seconds, or for as long as told by your health care provider. 6. Slowly return to the starting position. Repeat ___10_______ times. Complete this exercise _______2___ times a day. Shoulder flexion, standing  1. Stand and hold a broomstick, a cane, or a similar object. Place your hands a little more than shoulder width apart on the object. Your left / right hand should be palm up, and your other hand should be palm down. 2. Keep your elbow straight and your shoulder muscles  relaxed. Push the stick up with your healthy arm to raise your left / right arm in front of your body, and then over your head until you feel a stretch in your shoulder (flexion). ? Avoid shrugging your shoulder while you raise your arm. Keep your shoulder blade tucked down toward the middle of your back. 3. Hold for ____15_____ seconds. 4. Slowly return to the starting position. Repeat __10________ times. Complete this exercise  ____2______ times a day. Shoulder abduction, standing 1. Stand and hold a broomstick, a cane, or a similar object. Place your hands a little more than shoulder width apart on the object. Your left / right hand should be palm up, and your other hand should be palm down. 2. Keep your elbow straight and your shoulder muscles relaxed. Push the object across your body toward your left / right side. Raise your left / right arm to the side of your body (abduction) until you feel a stretch in your shoulder. ? Do not raise your arm above shoulder height unless your health care provider tells you to do that. ? If directed, raise your arm over your head. ? Avoid shrugging your shoulder while you raise your arm. Keep your shoulder blade tucked down toward the middle of your back. 3. Hold for __15________ seconds. 4. Slowly return to the starting position. Repeat ____10______ times. Complete this exercise _____2_____ times a day. Internal rotation  1. Place your left / right hand behind your back, palm up. 2. Use your other hand to dangle an exercise band, a towel, or a similar object over your shoulder. Grasp the band with your left / right hand so you are holding on to both ends. 3. Gently pull up on the band until you feel a stretch in the front of your left / right shoulder. The movement of your arm toward the center of your body is called internal rotation. ? Avoid shrugging your shoulder while you raise your arm. Keep your shoulder blade tucked down toward the middle of your back. 4. Hold for _____15_____ seconds. 5. Release the stretch by letting go of the band and lowering your hands. Repeat _____10_____ times. Complete this exercise _____2External rotation  1. Sit in a stable chair without armrests. 2. Secure an exercise band to a stable object at elbow height on your left / right side. 3. Place a soft object, such as a folded towel or a small pillow, between your left / right upper arm and your  body to move your elbow about 4 inches (10 cm) away from your side. 4. Hold the end of the exercise band so it is tight and there is no slack. 5. Keeping your elbow pressed against the soft object, slowly move your forearm out, away from your abdomen (external rotation). Keep your body steady so only your forearm moves. 6. Hold for __________ seconds. 7. Slowly return to the starting position. Repeat __________ times. Complete this exercise __________ times a day. Shoulder abduction  1. Sit in a stable chair without armrests, or stand up. 2. Hold a __________ weight in your left / right hand, or hold an exercise band with both hands. 3. Start with your arms straight down and your left / right palm facing in, toward your body. 4. Slowly lift your left / right hand out to your side (abduction). Do not lift your hand above shoulder height unless your health care provider tells you that this is safe. ? Keep your arms straight. ? Avoid shrugging  your shoulder while you do this movement. Keep your shoulder blade tucked down toward the middle of your back. 5. Hold for __________ seconds. 6. Slowly lower your arm, and return to the starting position. Repeat __________ times. Complete this exercise __________ times a day. Shoulder extension 1. Sit in a stable chair without armrests, or stand up. 2. Secure an exercise band to a stable object in front of you so it is at shoulder height. 3. Hold one end of the exercise band in each hand. Your palms should face each other. 4. Straighten your elbows and lift your hands up to shoulder height. 5. Step back, away from the secured end of the exercise band, until the band is tight and there is no slack. 6. Squeeze your shoulder blades together as you pull your hands down to the sides of your thighs (extension). Stop when your hands are straight down by your sides. Do not let your hands go behind your body. 7. Hold for ____15______ seconds. 8. Slowly return to  the starting position. Repeat ___10______ times. Complete this exercise ____2______ times a day. Shoulder row 1. Sit in a stable chair without armrests, or stand up. 2. Secure an exercise band to a stable object in front of you so it is at waist height. 3. Hold one end of the exercise band in each hand. Position your palms so that your thumbs are facing the ceiling (neutral position). 4. Bend each of your elbows to a 90-degree angle (right angle) and keep your upper arms at your sides. 5. Step back until the band is tight and there is no slack. 6. Slowly pull your elbows back behind you. 7. Hold for ____15______ seconds. 8. Slowly return to the starting position. Repeat ____10______ times. Complete this exercise ______2____ times a day. Shoulder press-ups  1. Sit in a stable chair that has armrests. Sit upright, with your feet flat on the floor. 2. Put your hands on the armrests so your elbows are bent and your fingers are pointing forward. Your hands should be about even with the sides of your body. 3. Push down on the armrests and use your arms to lift yourself off the chair. Straighten your elbows and lift yourself up as much as you comfortably can. ? Move your shoulder blades down, and avoid letting your shoulders move up toward your ears. ? Keep your feet on the ground. As you get stronger, your feet should support less of your body weight as you lift yourself up. 4. Hold for ____15______ seconds. 5. Slowly lower yourself back into the chair. Repeat ___10_______ times. Complete this exercise __2________ times a day. Wall push-ups  1. Stand so you are facing a stable wall. Your feet should be about one arm-length away from the wall. 2. Lean forward and place your palms on the wall at shoulder height. 3. Keep your feet flat on the floor as you bend your elbows and lean forward toward the wall. 4. Hold for ___15_______ seconds. 5. Straighten your elbows to push yourself back to the  starting position. Repeat ____10______ times. Complete this exercise _____2_____ times a day. This information is not intended to replace advice given to you by your health care provider. Make sure you discuss any questions you have with your health care provider. Document Released: 01/19/2005 Document Revised: 06/29/2018 Document Reviewed: 04/06/2018 Elsevier Patient Education  2020 [employer].

## 2019-03-18 ENCOUNTER — Ambulatory Visit (INDEPENDENT_AMBULATORY_CARE_PROVIDER_SITE_OTHER): Payer: Medicare Other | Admitting: Specialist

## 2019-03-18 ENCOUNTER — Encounter: Payer: Self-pay | Admitting: Specialist

## 2019-03-18 ENCOUNTER — Other Ambulatory Visit: Payer: Self-pay

## 2019-03-18 VITALS — BP 167/86 | HR 59 | Ht 72.0 in | Wt 248.0 lb

## 2019-03-18 DIAGNOSIS — M5136 Other intervertebral disc degeneration, lumbar region: Secondary | ICD-10-CM | POA: Diagnosis not present

## 2019-03-18 DIAGNOSIS — M47812 Spondylosis without myelopathy or radiculopathy, cervical region: Secondary | ICD-10-CM

## 2019-03-18 NOTE — Progress Notes (Signed)
   Office Visit Note   Patient: Jeffery White           Date of Birth: January 17, 1934           MRN: [id number] Visit Date: 03/18/2019              Requested by: Wenda Low, MD [address] [place],  [place] [address] PCP: Wenda Low, MD   Assessment & Plan: Visit Diagnoses:  1. Spondylosis without myelopathy or radiculopathy, cervical region   2. Degenerative disc disease, lumbar     Plan: Plan: Avoid overhead lifting and overhead use of the arms. Do not lift greater than 5 lbs. Adjust head rest in vehicle to prevent hyperextension if rear ended. Take extra precautions to avoid falling, including use of a cane if you feel weak. Avoid frequent bending and stooping  No lifting greater than 10 lbs. May use ice or moist heat for pain. Weight loss is of benefit. Best medication for lumbar disc disease is arthritis medications like voltaren gel and CBD either lotion or oral meds.  Exercise is important to improve your indurance and does allow people to function better inspite of back pain. Continue with pain management.  Follow-Up Instructions: No follow-ups on file.   Orders:  No orders of the defined types were placed in this encounter.  No orders of the defined types were placed in this encounter.     Procedures: No procedures performed   Clinical Data: No additional findings.   Subjective: Chief Complaint  Patient presents with  . Neck - Follow-up    HPI  Review of Systems   Objective: Vital Signs: BP (!) 167/86 (BP Location: Left Arm, Patient Position: Sitting)   Pulse (!) 59   Ht 6' (1.829 m)   Wt 248 lb (112.5 kg)   BMI 33.63 kg/m   Physical Exam  Ortho Exam  Specialty Comments:  No specialty comments available.  Imaging: No results found.   PMFS History: Patient Active Problem List   Diagnosis Date Noted  . 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 Unilateral primary osteoarthritis, right hip  03/06/2017   Past Medical History:  Diagnosis Date  . Chronic back pain   . Gout   . Hypercholesteremia   . Hypertension     History reviewed. No pertinent family history.  Past Surgical History:  Procedure Laterality Date  . COLONOSCOPY    . TOTAL HIP ARTHROPLASTY Right 04/14/2017   Procedure: RIGHT TOTAL HIP ARTHROPLASTY ANTERIOR APPROACH;  Surgeon: Mcarthur Rossetti, MD;  Location: [facility];  Service: Orthopedics;  Laterality: Right;   Social History   Occupational History  . Not on file  Tobacco Use  . Smoking status: Never Smoker  . Smokeless tobacco: Never Used  Substance and Sexual Activity  . Alcohol use: No  . Drug use: No  . Sexual activity: Not on file

## 2019-03-18 NOTE — Patient Instructions (Signed)
Plan: Avoid overhead lifting and overhead use of the arms. Do not lift greater than 5 lbs. Adjust head rest in vehicle to prevent hyperextension if rear ended. Take extra precautions to avoid falling, including use of a cane if you feel weak. Avoid frequent bending and stooping  No lifting greater than 10 lbs. May use ice or moist heat for pain. Weight loss is of benefit. Best medication for lumbar disc disease is arthritis medications like voltaren gel and CBD either lotion or oral meds.  Exercise is important to improve your indurance and does allow people to function better inspite of back pain. Continue with pain management.

## 2019-04-07 ENCOUNTER — Other Ambulatory Visit: Payer: Self-pay | Admitting: Specialist

## 2019-05-30 ENCOUNTER — Ambulatory Visit: Payer: Medicaid Other | Attending: Internal Medicine

## 2019-05-30 DIAGNOSIS — Z23 Encounter for immunization: Secondary | ICD-10-CM

## 2019-05-30 NOTE — Progress Notes (Signed)
   Covid-19 Vaccination Clinic  Name:  Jeffery White    MRN: [id number] DOB: 28-Jun-1933  05/30/2019  [name] was observed post Covid-19 immunization for 15 minutes without incident. He was provided with Vaccine Information Sheet and instruction to access the V-Safe system.   [name] was instructed to call 911 with any severe reactions post vaccine: Marland Kitchen Difficulty breathing  . Swelling of face and throat  . A fast heartbeat  . A bad rash all over body  . Dizziness and weakness   Immunizations Administered    Name Date Dose VIS Date Route   Pfizer COVID-19 Vaccine 05/30/2019  1:41 PM 0.3 mL 03/01/2019 Intramuscular   Manufacturer: [employer], [employer]   Lot: [id number]   NDC: [address]-9

## 2019-06-13 ENCOUNTER — Other Ambulatory Visit: Payer: Self-pay | Admitting: Specialist

## 2019-06-24 ENCOUNTER — Ambulatory Visit: Payer: Medicare Other | Attending: Internal Medicine

## 2019-06-24 DIAGNOSIS — Z23 Encounter for immunization: Secondary | ICD-10-CM

## 2019-06-24 NOTE — Progress Notes (Signed)
   Covid-19 Vaccination Clinic  Name:  Jeffery White    MRN: [id number] DOB: [birth year]  06/24/2019  [name] was observed post Covid-19 immunization for 15 minutes without incident. He was provided with Vaccine Information Sheet and instruction to access the V-Safe system.   Mr. Daily was instructed to call 911 with any severe reactions post vaccine: Marland Kitchen Difficulty breathing  . Swelling of face and throat  . A fast heartbeat  . A bad rash all over body  . Dizziness and weakness   Immunizations Administered    Name Date Dose VIS Date Route   Pfizer COVID-19 Vaccine 06/24/2019 10:14 AM 0.3 mL 03/01/2019 Intramuscular   Manufacturer: [employer], [employer]   Lot: [id number]   NDC: [address]-0

## 2019-08-18 ENCOUNTER — Other Ambulatory Visit: Payer: Self-pay | Admitting: Specialist

## 2019-09-16 ENCOUNTER — Encounter: Payer: Self-pay | Admitting: Specialist

## 2019-09-16 ENCOUNTER — Ambulatory Visit (INDEPENDENT_AMBULATORY_CARE_PROVIDER_SITE_OTHER): Payer: Medicare Other | Admitting: Specialist

## 2019-09-16 ENCOUNTER — Other Ambulatory Visit: Payer: Self-pay

## 2019-09-16 VITALS — BP 143/87 | HR 64 | Ht 72.0 in | Wt 247.0 lb

## 2019-09-16 DIAGNOSIS — M5136 Other intervertebral disc degeneration, lumbar region: Secondary | ICD-10-CM

## 2019-09-16 DIAGNOSIS — M7061 Trochanteric bursitis, right hip: Secondary | ICD-10-CM | POA: Diagnosis not present

## 2019-09-16 DIAGNOSIS — M47812 Spondylosis without myelopathy or radiculopathy, cervical region: Secondary | ICD-10-CM | POA: Diagnosis not present

## 2019-09-16 DIAGNOSIS — Z96641 Presence of right artificial hip joint: Secondary | ICD-10-CM

## 2019-09-16 MED ORDER — DICLOFENAC SODIUM 1 % EX GEL: 4.0000 g | Freq: Four times a day (QID) | CUTANEOUS | 3 refills | Status: DC

## 2019-09-16 NOTE — Patient Instructions (Addendum)
Plan: Avoid overhead lifting and overhead use of the arms. Do not lift greater than 5 lbs. Adjust head rest in vehicle to prevent hyperextension if rear ended. Take extra precautions to avoid falling, including use of a cane if you feel weak. Avoid frequent bending and stooping  No lifting greater than 10 lbs. May use ice or moist heat for pain. Weight loss is of benefit. Best medication for lumbar disc disease is arthritis medications like voltaren gel and CBD either lotion or oral meds.  Exercise is important to improve your indurance and does allow people to function better inspite of back pain. Continue with pain management. Trochanteric bursitis with swelling lateral right thigh. Use the voltaren or diclofenac gel. Do stretching exercises. Schedule an appointment with Dr. Magnus Ivan.

## 2019-09-16 NOTE — Progress Notes (Signed)
Office Visit Note   Patient: Jeffery White           Date of Birth: 17-Aug-1933           MRN: [id number] Visit Date: 09/16/2019              Requested by: Georgann Housekeeper, MD [address] [place],  [place] [address] PCP: Georgann Housekeeper, MD   Assessment & Plan: Visit Diagnoses:  1. Trochanteric bursitis, right hip   2. Spondylosis without myelopathy or radiculopathy, cervical region   3. Degenerative disc disease, lumbar   4. History of total right hip replacement   5. Status post total replacement of right hip     Plan: Avoid overhead lifting and overhead use of the arms. Do not lift greater than 5 lbs. Adjust head rest in vehicle to prevent hyperextension if rear ended. Take extra precautions to avoid falling, including use of a cane if you feel weak. Avoid frequent bending and stooping  No lifting greater than 10 lbs. May use ice or moist heat for pain. Weight loss is of benefit. Best medication for lumbar disc disease is arthritis medications like voltaren gel and CBD either lotion or oral meds.  Exercise is important to improve your indurance and does allow people to function better inspite of back pain. Continue with pain management. Trochanteric bursitis with swelling lateral right thigh. Use the voltaren or diclofenac gel. Do stretching exercises. Schedule an appointment with Dr. Erline Hau Follow-Up Instructions: Return in about 1 year (around 09/15/2020).   Orders:  No orders of the defined types were placed in this encounter.  No orders of the defined types were placed in this encounter.     Procedures: No procedures performed   Clinical Data: No additional findings.   Subjective: Chief Complaint  Patient presents with  . Neck - Follow-up    86 year old male with history of right hip pain and right thigh swelling. Previous right THR. He has history of cervical spondylosis and lumbar spondylosis. Uses a cane for ambulation. Right thigh  swelling began. He is in a pain management program with [facility] and takes percocet TID when needed but doesn't always need it. No bowel or bladder difficullty. He could feel Soreness in the right lateral thigh but it is better, not like it was. He is able to walk and mows his yard. The right thigh swelling is better.     Review of Systems  Constitutional: Positive for unexpected weight change (lost on purpose and now is down to 248). Negative for activity change, appetite change, chills, diaphoresis, fatigue and fever.  HENT: Positive for sinus pressure. Negative for congestion, dental problem, drooling, ear discharge, ear pain, facial swelling, hearing loss, mouth sores, nosebleeds, postnasal drip, rhinorrhea, sinus pain, sneezing, sore throat, tinnitus, trouble swallowing and voice change.   Eyes: Negative for photophobia, pain, discharge, redness, itching and visual disturbance.  Respiratory: Negative.  Negative for apnea, cough, choking, chest tightness, shortness of breath, wheezing and stridor.   Cardiovascular: Negative.   Endocrine: Negative.  Negative for cold intolerance, heat intolerance, polydipsia and polyphagia.  Genitourinary: Negative.  Negative for difficulty urinating, dysuria, enuresis and flank pain.  Allergic/Immunologic: Negative.   Neurological: Negative.   Hematological: Negative.      Objective: Vital Signs: BP (!) 143/87   Pulse 64   Ht 6' (1.829 m)   Wt 247 lb (112 kg)   BMI 33.50 kg/m   Physical Exam Musculoskeletal:  Lumbar back: Negative right straight leg raise test and negative left straight leg raise test.     Back Exam   Range of Motion  Extension: normal  Flexion: normal  Lateral bend right:  50 normal  Rotation right: normal   Muscle Strength  Right Quadriceps:  5/5  Left Quadriceps:  5/5  Right Hamstrings:  5/5  Left Hamstrings:  5/5   Tests  Straight leg raise right: negative Straight leg raise left:  negative  Reflexes  Patellar: 0/4 Achilles: 0/4  Other  Toe walk: normal Heel walk: normal Sensation: normal Gait: normal  Erythema: back redness Scars: present      Specialty Comments:  No specialty comments available.  Imaging: No results found.   PMFS History: Patient Active Problem List   Diagnosis Date Noted  . 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 Unilateral primary osteoarthritis, right hip 03/06/2017   Past Medical History:  Diagnosis Date  . Chronic back pain   . Gout   . Hypercholesteremia   . Hypertension     History reviewed. No pertinent family history.  Past Surgical History:  Procedure Laterality Date  . COLONOSCOPY    . TOTAL HIP ARTHROPLASTY Right 04/14/2017   Procedure: RIGHT TOTAL HIP ARTHROPLASTY ANTERIOR APPROACH;  Surgeon: Mcarthur Rossetti, MD;  Location: [facility];  Service: Orthopedics;  Laterality: Right;   Social History   Occupational History  . Not on file  Tobacco Use  . Smoking status: Never Smoker  . Smokeless tobacco: Never Used  Vaping Use  . Vaping Use: Never used  Substance and Sexual Activity  . Alcohol use: No  . Drug use: No  . Sexual activity: Not on file

## 2019-09-20 ENCOUNTER — Telehealth: Payer: Self-pay | Admitting: Radiology

## 2019-09-20 NOTE — Telephone Encounter (Signed)
Patient called, stated he has new phone number and please add to his chart.  [phone]

## 2019-11-07 ENCOUNTER — Other Ambulatory Visit: Payer: Self-pay | Admitting: Specialist

## 2019-11-07 NOTE — Telephone Encounter (Signed)
Please advise 

## 2019-12-02 IMAGING — DX DG PORTABLE PELVIS
1 series · 1 of 1 positions shown · non-contrast
Comparison: 07/04/2016

CLINICAL DATA: Status post right hip replacement

EXAM:
PORTABLE PELVIS 1-2 VIEWS

[pelvis ap]
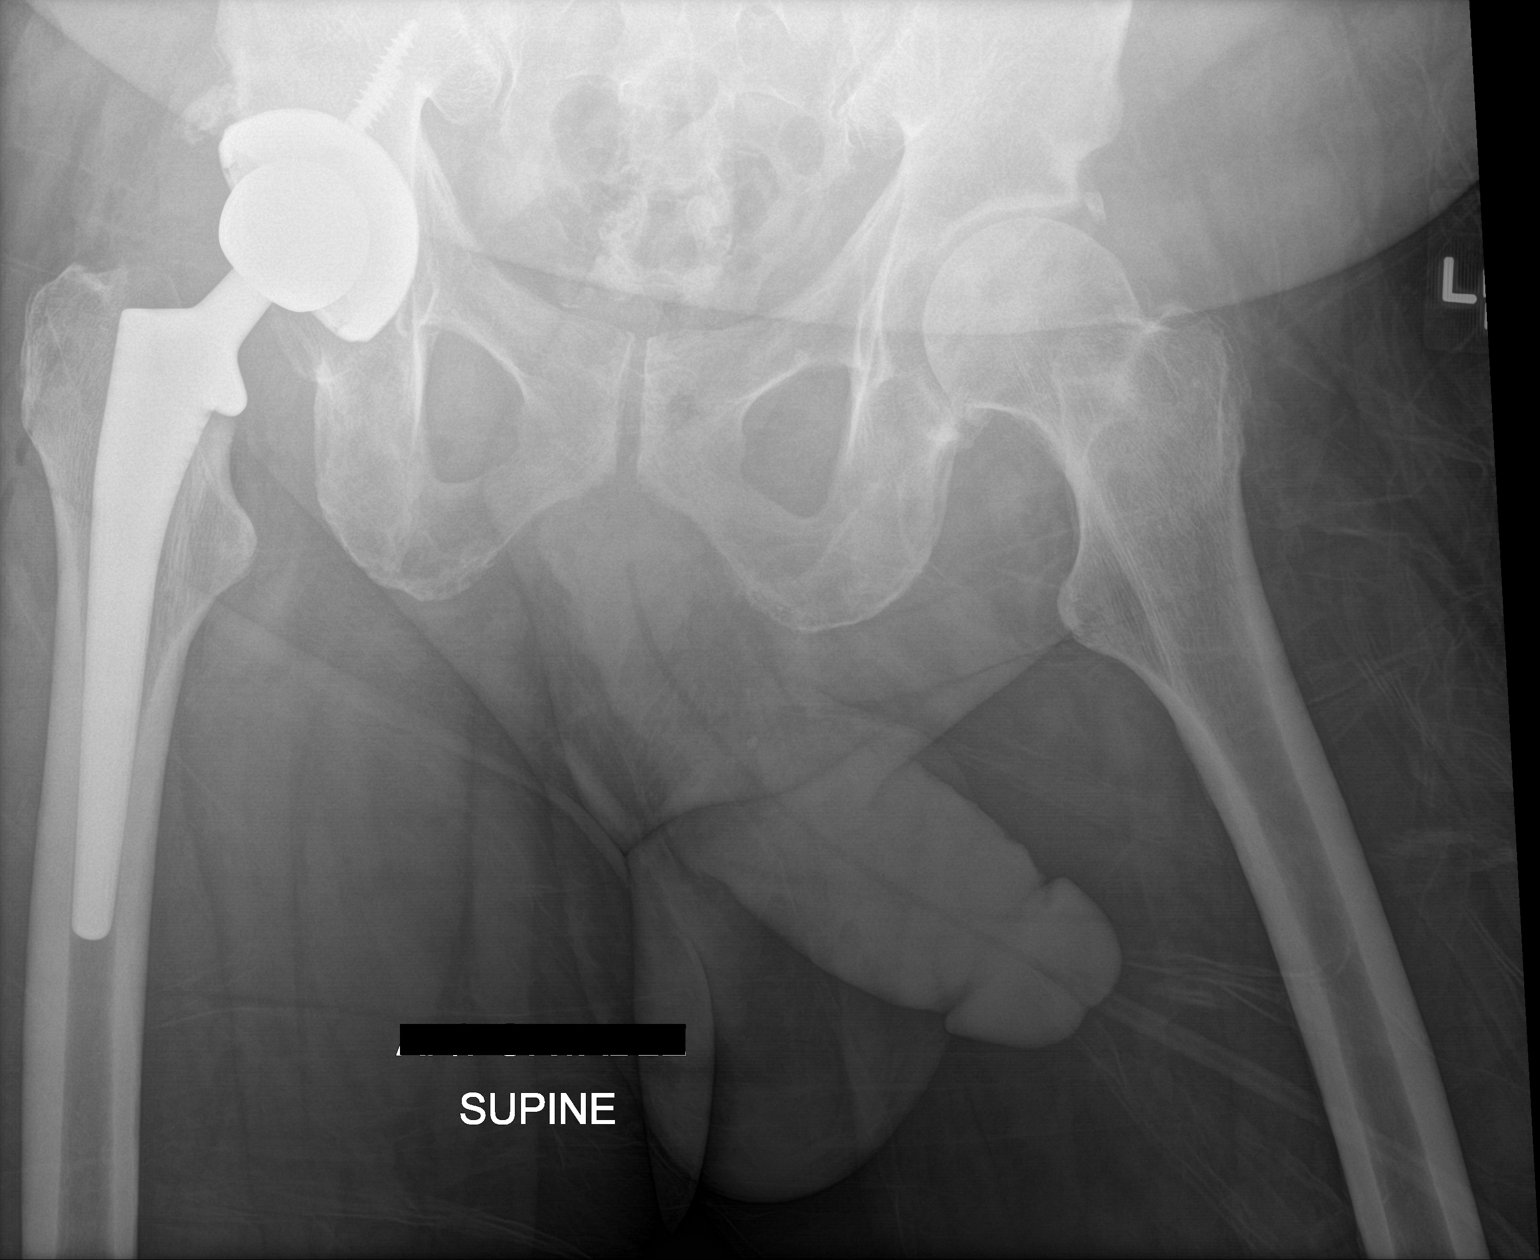

[1 of 1 positions shown; findings below may reference images not displayed]

FINDINGS: Right hip prosthesis is noted in satisfactory position. No acute
bony abnormality is noted. Osteophytic changes are noted along the
superior aspect of the acetabulum on the right.
IMPRESSION: Status post right hip replacement.

## 2019-12-25 ENCOUNTER — Other Ambulatory Visit: Payer: Self-pay

## 2019-12-25 NOTE — Telephone Encounter (Signed)
Patient would like a Rx refill on Diclofenac.  Cb# [phone].  Please advise.  Thank you.

## 2019-12-26 MED ORDER — DICLOFENAC SODIUM 1 % EX GEL: 4.0000 g | Freq: Four times a day (QID) | CUTANEOUS | 3 refills | Status: DC

## 2019-12-26 NOTE — Addendum Note (Signed)
Addended by: Penne Lash, Otis Dials on: 12/26/2019 08:22 AM   Modules accepted: Orders

## 2020-03-30 ENCOUNTER — Encounter: Payer: Self-pay | Admitting: Specialist

## 2020-03-30 ENCOUNTER — Ambulatory Visit (INDEPENDENT_AMBULATORY_CARE_PROVIDER_SITE_OTHER): Payer: Medicare Other | Admitting: Specialist

## 2020-03-30 ENCOUNTER — Ambulatory Visit (INDEPENDENT_AMBULATORY_CARE_PROVIDER_SITE_OTHER): Payer: Medicare Other

## 2020-03-30 ENCOUNTER — Other Ambulatory Visit: Payer: Self-pay

## 2020-03-30 VITALS — BP 172/99 | HR 80 | Ht 72.0 in | Wt 228.4 lb

## 2020-03-30 DIAGNOSIS — Z96641 Presence of right artificial hip joint: Secondary | ICD-10-CM | POA: Diagnosis not present

## 2020-03-30 DIAGNOSIS — M1612 Unilateral primary osteoarthritis, left hip: Secondary | ICD-10-CM | POA: Diagnosis not present

## 2020-03-30 DIAGNOSIS — M5136 Other intervertebral disc degeneration, lumbar region: Secondary | ICD-10-CM | POA: Diagnosis not present

## 2020-03-30 DIAGNOSIS — M25552 Pain in left hip: Secondary | ICD-10-CM | POA: Diagnosis not present

## 2020-03-30 DIAGNOSIS — M25559 Pain in unspecified hip: Secondary | ICD-10-CM | POA: Diagnosis not present

## 2020-03-30 NOTE — Progress Notes (Signed)
Office Visit Note   Patient: Jeffery White           Date of Birth: 12-09-1933           MRN: [id number] Visit Date: 03/30/2020              Requested by: Georgann Housekeeper, MD [address] [place],  [place] [address] PCP: Georgann Housekeeper, MD   Assessment & Plan: Visit Diagnoses:  1. Hip pain   2. Status post total replacement of right hip   3. Degenerative disc disease, lumbar   4. Unilateral primary osteoarthritis, left hip     Plan: Plan: left hip is suffering from osteoarthritis, only real proven treatments are Weight loss, NSIADs like mobic and motrin and exercise. Well padded shoes help. Ice the hip that is suffering from osteoarthritis, only real proven treatments are Ice the hip 2-3 times a day 15-20 mins at a time 3 times a day 15-20 mins at a time. Hot showers in the AM.  Injection with steroid may be of benefit. Hemp CBD capsules, amazon.com 5,000-7,000 mg per bottle, 60 capsules per bottle, take one capsule twice a day. Cane in the right hand to use with left leg weight bearing. Follow-Up Instructions: No follow-ups on file. We will arrange an appointment with Dr.Blackman to assess your left hip to decide if surgery is appropriate.   Follow-Up Instructions: Return Return appointment with Dr. Magnus Ivan..   Orders:  Orders Placed This Encounter  Procedures  . XR HIP UNILAT W OR W/O PELVIS 2-3 VIEWS LEFT   No orders of the defined types were placed in this encounter.     Procedures: No procedures performed   Clinical Data: No additional findings.   Subjective: Chief Complaint  Patient presents with  . Left Hip - Pain    87 year old male with 2 week history of left hip pain. He has noticed that if he rotates the left hip internally there is pain but with external rotation and flexion there is no pain. Sometimes the pain will make him stop and take weight off the left leg and move it to decrease the pain. He has no difficulty with stairs, 2  steps to get into the house is okay. No bowel or bladder difficulty. He has pain with IR. Pain was severe enough to prevent him from walking or fishing. Previous right THR by Dr. Magnus Ivan  is doing well and he is not hurting in the right hip at all.    Review of Systems  Constitutional: Negative.   HENT: Negative.   Respiratory: Negative.   Cardiovascular: Negative.   Gastrointestinal: Negative.   Endocrine: Negative.   Genitourinary: Negative.   Skin: Negative.   Allergic/Immunologic: Negative.   Neurological: Negative.   Hematological: Negative.   Psychiatric/Behavioral: Negative.      Objective: Vital Signs: BP (!) 172/99 (BP Location: Left Arm, Patient Position: Sitting)   Pulse 80   Ht 6' (1.829 m)   Wt 228 lb 6.4 oz (103.6 kg)   BMI 30.98 kg/m   Physical Exam  Left Hip Exam   Tenderness  The patient is experiencing tenderness in the anterior.  Range of Motion  Abduction: abnormal  Adduction: abnormal  Extension: normal  Flexion: abnormal  External rotation: 30  Internal rotation: 0 abnormal   Muscle Strength  Abduction: 4/5  Adduction: 4/5  Flexion: 4/5   Tests  FABER: negative Ober: negative  Other  Erythema: absent Scars: absent  Specialty Comments:  No specialty comments available.  Imaging: XR HIP UNILAT W OR W/O PELVIS 2-3 VIEWS LEFT  Result Date: 03/30/2020 AP pelvis and lateral left hip shows right THR in good position and alignment. Left hip with superior migration of the femoral head in the socket there is central and inferior left femoral head osteophytes with narrowing of the superior left hip joint line to bone on bone appearance superiorly. There is subchondral sclerosis associated with the bone on bone appearance area of the left superior hip joint line.     PMFS History: Patient Active Problem List   Diagnosis Date Noted  . 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 Unilateral primary osteoarthritis, right  hip 03/06/2017   Past Medical History:  Diagnosis Date  . Chronic back pain   . Gout   . Hypercholesteremia   . Hypertension     No family history on file.  Past Surgical History:  Procedure Laterality Date  . COLONOSCOPY    . TOTAL HIP ARTHROPLASTY Right 04/14/2017   Procedure: RIGHT TOTAL HIP ARTHROPLASTY ANTERIOR APPROACH;  Surgeon: Kathryne Hitch, MD;  Location: [facility];  Service: Orthopedics;  Laterality: Right;   Social History   Occupational History  . Not on file  Tobacco Use  . Smoking status: Never Smoker  . Smokeless tobacco: Never Used  Vaping Use  . Vaping Use: Never used  Substance and Sexual Activity  . Alcohol use: No  . Drug use: No  . Sexual activity: Not on file

## 2020-03-30 NOTE — Patient Instructions (Signed)
Plan: left hip is suffering from osteoarthritis, only real proven treatments are Weight loss, NSIADs like mobic and motrin and exercise. Well padded shoes help. Ice the hip that is suffering from osteoarthritis, only real proven treatments are Ice the hip 2-3 times a day 15-20 mins at a time 3 times a day 15-20 mins at a time. Hot showers in the AM.  Injection with steroid may be of benefit. Hemp CBD capsules, amazon.com 5,000-7,000 mg per bottle, 60 capsules per bottle, take one capsule twice a day. Cane in the right hand to use with left leg weight bearing. Follow-Up Instructions: No follow-ups on file. We will arrange an appointment with Jeffery White to assess your left hip to decide if surgery is appropriate.

## 2020-06-27 ENCOUNTER — Other Ambulatory Visit: Payer: Self-pay | Admitting: Specialist

## 2020-12-17 ENCOUNTER — Other Ambulatory Visit: Payer: Self-pay | Admitting: Radiology

## 2020-12-17 MED ORDER — DICLOFENAC SODIUM 1 % EX GEL: CUTANEOUS | 1 refills | Status: DC

## 2021-02-23 ENCOUNTER — Other Ambulatory Visit: Payer: Self-pay | Admitting: Specialist

## 2021-04-20 ENCOUNTER — Other Ambulatory Visit: Payer: Self-pay | Admitting: Specialist

## 2021-06-03 ENCOUNTER — Other Ambulatory Visit: Payer: Self-pay

## 2021-06-03 ENCOUNTER — Ambulatory Visit (INDEPENDENT_AMBULATORY_CARE_PROVIDER_SITE_OTHER): Payer: Medicare Other | Admitting: Specialist

## 2021-06-03 ENCOUNTER — Ambulatory Visit: Payer: Self-pay

## 2021-06-03 ENCOUNTER — Encounter: Payer: Self-pay | Admitting: Specialist

## 2021-06-03 VITALS — BP 149/92 | HR 61 | Ht 72.0 in | Wt 228.4 lb

## 2021-06-03 DIAGNOSIS — Z96641 Presence of right artificial hip joint: Secondary | ICD-10-CM

## 2021-06-03 DIAGNOSIS — M25551 Pain in right hip: Secondary | ICD-10-CM | POA: Diagnosis not present

## 2021-06-03 DIAGNOSIS — M1612 Unilateral primary osteoarthritis, left hip: Secondary | ICD-10-CM

## 2021-06-03 NOTE — Progress Notes (Signed)
? ?Office Visit Note ?  ?Patient: Jeffery White           ?Date of Birth: 10/30/1933           ?MRN: [id number] ?Visit Date: 06/03/2021 ?             ?Requested by: Wenda Low, MD ?[address] ?[address] ?[place],   [address] ?PCP: Wenda Low, MD ? ? ?Assessment & Plan: ?Visit Diagnoses:  ?1. Hip pain   ?2. History of total right hip replacement   ?3. Unilateral primary osteoarthritis, left hip   ? ? ?Plan: Plan: left hip is suffering from osteoarthritis, only real proven treatments are ?Weight loss, NSIADs like motrin and naprosyn and exercise. ?Well padded shoes help. ?Ice the hip that is suffering from osteoarthritis, only real proven treatments are ? ?Ice the hip 2-3 times a day 15-20 mins at a time.-3 times a day 15-20 mins at a time. Hot showers in the AM.  ?Injection with steroid may be of benefit. ?Hemp CBD capsules, amazon.com 5,000-7,000 mg per bottle, 60 capsules per bottle, take one capsule twice a day. ?Cane in the right hand to use with left leg weight bearing. ?Make an appt to see Dr. Ninfa Linden for total hip replacement. ?Follow-Up Instructions: No follow-ups on file.  ? ?Follow-Up Instructions: Return in about 2 weeks (around 06/17/2021) for Please schedule an appointment with Dr. Ninfa Linden for eval for a left THR it's time..  ? ?Orders:  ?Orders Placed This Encounter  ?Procedures  ? XR HIP UNILAT W OR W/O PELVIS 2-3 VIEWS LEFT  ? ?No orders of the defined types were placed in this encounter. ? ? ? ? Procedures: ?No procedures performed ? ? ?Clinical Data: ?No additional findings. ? ? ?Subjective: ?Chief Complaint  ?Patient presents with  ? Left Hip - Pain  ? ? ?88 year old male with history of lumbar and cervical spondylosis, he has done well with conservative management and with fishing for therapy. He is going to go [place] for [employer] He is experiencing left hip pain that is constant and due to turning the left leg and hip. He has pain with initiating weight bearing and with  first standing on the left hip and leg in the AM. He had right THR by Dr.Blackman with excellent result. ? ?Review of Systems  ?Constitutional: Negative.   ?HENT: Negative.    ?Eyes: Negative.   ?Respiratory: Negative.    ?Cardiovascular: Negative.   ?Gastrointestinal: Negative.   ?Endocrine: Negative.   ?Genitourinary: Negative.   ?Musculoskeletal: Negative.   ?Skin: Negative.   ?Allergic/Immunologic: Negative.   ?Neurological: Negative.   ?Hematological: Negative.   ?Psychiatric/Behavioral: Negative.    ? ? ?Objective: ?Vital Signs: BP (!) 149/92 (BP Location: Left Arm, Patient Position: Sitting)   Pulse 61   Ht 6' (1.829 m)   Wt 228 lb 6.4 oz (103.6 kg)   BMI 30.98 kg/m?  ? ?Physical Exam ?Constitutional:   ?   Appearance: He is well-developed.  ?HENT:  ?   Head: Normocephalic and atraumatic.  ?Eyes:  ?   Pupils: Pupils are equal, round, and reactive to light.  ?Pulmonary:  ?   Effort: Pulmonary effort is normal.  ?   Breath sounds: Normal breath sounds.  ?Abdominal:  ?   General: Bowel sounds are normal.  ?   Palpations: Abdomen is soft.  ?Musculoskeletal:  ?   Cervical back: Normal range of motion and neck supple.  ?   Lumbar back:  Negative right straight leg raise test and negative left straight leg raise test.  ?Skin: ?   General: Skin is warm and dry.  ?Neurological:  ?   Mental Status: He is alert and oriented to person, place, and time.  ?Psychiatric:     ?   Behavior: Behavior normal.     ?   Thought Content: Thought content normal.     ?   Judgment: Judgment normal.  ? ?Right Hip Exam  ?Right hip exam is normal.  ? ?Tenderness  ?The patient is experiencing no tenderness.  ? ? ?Left Hip Exam  ? ?Tenderness  ?The patient is experiencing tenderness in the greater trochanter and anterior. ? ?Range of Motion  ?Abduction:  abnormal  ?Adduction:  normal  ?Extension:  abnormal  ?Flexion:  100 abnormal  ?External rotation:  20  ?Internal rotation: 0  ? ?Muscle Strength  ?Abduction: 4/5  ?Adduction: 4/5   ?Flexion: 4/5  ? ?Tests  ?FABER: positive ?Ober: positive ? ?Other  ?Erythema: absent ?Scars: absent ?Sensation: normal ? ?Comments:  Painful ROM left hip ? ? ?Back Exam  ? ?Tenderness  ?The patient is experiencing tenderness in the lumbar. ? ?Range of Motion  ?Extension:  normal  ?Flexion:  normal  ?Lateral bend right:  normal  ?Lateral bend left:  normal  ?Rotation right:  normal  ?Rotation left:  normal  ? ?Tests  ?Straight leg raise right: negative ?Straight leg raise left: negative ? ?Other  ?Toe walk: normal ?Heel walk: normal ?Sensation: normal ? ? ? ?Specialty Comments:  ?No specialty comments available. ? ?Imaging: ?No results found. ? ? ?PMFS History: ?Patient Active Problem List  ? Diagnosis Date Noted  ? Status post total replacement of right hip 04/14/2017  ? Unilateral primary osteoarthritis, right hip 03/06/2017  ? ?Past Medical History:  ?Diagnosis Date  ? Chronic back pain   ? Gout   ? Hypercholesteremia   ? Hypertension   ?  ?No family history on file.  ?Past Surgical History:  ?Procedure Laterality Date  ? COLONOSCOPY    ? TOTAL HIP ARTHROPLASTY Right 04/14/2017  ? Procedure: RIGHT TOTAL HIP ARTHROPLASTY ANTERIOR APPROACH;  Surgeon: Mcarthur Rossetti, MD;  Location: [facility];  Service: Orthopedics;  Laterality: Right;  ? ?Social History  ? ?Occupational History  ? Not on file  ?Tobacco Use  ? Smoking status: Never  ? Smokeless tobacco: Never  ?Vaping Use  ? Vaping Use: Never used  ?Substance and Sexual Activity  ? Alcohol use: No  ? Drug use: No  ? Sexual activity: Not on file  ? ? ? ? ? ? ?

## 2021-06-03 NOTE — Patient Instructions (Addendum)
Plan: left hip is suffering from osteoarthritis, only real proven treatments are ?Weight loss, NSIADs like motrin and naprosyn and exercise. ?Well padded shoes help. ?Ice the hip that is suffering from osteoarthritis, only real proven treatments are ? ?Ice the hip 2-3 times a day 15-20 mins at a time.-3 times a day 15-20 mins at a time. Hot showers in the AM.  ?Injection with steroid may be of benefit. ?Hemp CBD capsules, amazon.com 5,000-7,000 mg per bottle, 60 capsules per bottle, take one capsule twice a day. ?Cane in the right hand to use with left leg weight bearing. ?Schedule an appointment with Dr. Magnus Ivan to have left total hip replacement. ? ?Follow-Up Instructions: No follow-ups on file.   ?

## 2021-06-21 ENCOUNTER — Ambulatory Visit (INDEPENDENT_AMBULATORY_CARE_PROVIDER_SITE_OTHER): Payer: Medicare Other | Admitting: Orthopaedic Surgery

## 2021-06-21 VITALS — Ht 72.0 in | Wt 228.4 lb

## 2021-06-21 DIAGNOSIS — M1612 Unilateral primary osteoarthritis, left hip: Secondary | ICD-10-CM

## 2021-06-21 DIAGNOSIS — M25552 Pain in left hip: Secondary | ICD-10-CM | POA: Diagnosis not present

## 2021-06-21 NOTE — Progress Notes (Signed)
The patient is well-known to me.  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  His left hip had no issues at that point.  However over the last year his left hip pain has become severe and it is daily.  He does ambulate with a cane.  He reports left hip groin pain.  He has tried failed conservative treatment for over 12 months.  He did see Dr. Louanne Skye recently and x-rays were obtained of his pelvis and left hip showing severe end-stage arthritis with bone-on-bone wear of the left hip.  At 72 he is still very active.  He is interested in a left hip replacement.  He is very pleased with his right hip replacement.  He has had no other acute change in medical status.  He currently denies any headache, chest pain, shortness of breath, fever, chills, nausea, vomiting.  I was able to review all of his records and medications in epic. ? ?His right operative hip moves smoothly and fluidly.  The left hip has limitations with internal and external rotation as well as severe pain in the groin with rotation of the left hip. ? ?I did go over his x-rays with him of his pelvis and left hip.  His right hip replacement looks well-seated and in good position.  His left hip has bone-on-bone arthritis with complete loss of the superior lateral joint space.  There are sclerotic changes and periarticular osteophytes. ? ?At this point he is interested in hip replacement surgery and I agree with this and given the profound arthritis in his left hip and the detrimental effect is having on his actives daily living and his quality of life as well as his mobility.  Also based on his clinical exam and x-ray findings showing severe arthritis of the left hip.  All questions and concerns were answered and addressed.  We will work on getting him scheduled for a left total hip replacement.  He knows our surgery scheduler will be in touch. ?

## 2021-07-13 ENCOUNTER — Other Ambulatory Visit: Payer: Self-pay

## 2021-07-24 ENCOUNTER — Other Ambulatory Visit: Payer: Self-pay | Admitting: Specialist

## 2021-08-11 ENCOUNTER — Other Ambulatory Visit: Payer: Self-pay | Admitting: Physician Assistant

## 2021-08-11 DIAGNOSIS — M1612 Unilateral primary osteoarthritis, left hip: Secondary | ICD-10-CM

## 2021-08-11 NOTE — Progress Notes (Addendum)
COVID Vaccine Completed: yes x2  Date of COVID positive in last 90 days:  PCP Sharyn Lull , MD Cardiologist - [facility]  Chest x-ray - n/a EKG - [facility] last week Stress Test - 03/08/05 Epic ECHO - n/a Cardiac Cath - n/a Pacemaker/ICD device last checked: n/a Spinal Cord Stimulator: n/a  Bowel Prep - no  Sleep Study - n/a CPAP -   Fasting Blood Sugar - n/a Checks Blood Sugar _____ times a day  Blood Thinner Instructions: Eliquis, no instructions, will call provider Aspirin Instructions: ASA 81, has not taken since last week Last Dose:  Activity level: Can go up a flight of stairs and perform activities of daily living without stopping and without symptoms of chest pain. SOB with exertion, not new       Anesthesia review: patient is taking Eliquis, unsure of indication. Will request office notes and EKG from Dr. Annitta Jersey office, HTN  Patient denies shortness of breath, fever, cough and chest pain at PAT appointment   Patient verbalized understanding of instructions that were given to them at the PAT appointment. Patient was also instructed that they will need to review over the PAT instructions again at home before surgery.

## 2021-08-11 NOTE — Patient Instructions (Addendum)
DUE TO COVID-19 ONLY TWO VISITORS  (aged 86 and older)  ARE ALLOWED TO COME WITH YOU AND STAY IN THE WAITING ROOM ONLY DURING PRE OP AND PROCEDURE.   **NO VISITORS ARE ALLOWED IN THE SHORT STAY AREA OR RECOVERY ROOM!!**  IF YOU WILL BE ADMITTED INTO THE HOSPITAL YOU ARE ALLOWED ONLY FOUR SUPPORT PEOPLE DURING VISITATION HOURS ONLY (7 AM -8PM)   The support person(s) must pass our screening, gel in and out, and wear a mask at all times, including in the patient's room. Patients must also wear a mask when staff or their support person are in the room. Visitors GUEST BADGE MUST BE WORN VISIBLY  One adult visitor may remain with you overnight and MUST be in the room by 8 P.M.     Your procedure is scheduled on: 08/20/21   Report to [facility] Main Entrance    Report to admitting at 6:40 AM   Call this number if you have problems the morning of surgery [phone]   Do not eat food :After Midnight.   After Midnight you may have the following liquids until 6:25 AM DAY OF SURGERY  Water Black Coffee (sugar ok, NO MILK/CREAM OR CREAMERS)  Tea (sugar ok, NO MILK/CREAM OR CREAMERS) regular and decaf                             Plain Jell-O (NO RED)                                           Fruit ices (not with fruit pulp, NO RED)                                     Popsicles (NO RED)                                                                  Juice: apple, WHITE grape, WHITE cranberry Sports drinks like Gatorade (NO RED) Clear broth(vegetable,chicken,beef)     The day of surgery:  Drink ONE (1) Pre-Surgery Clear Ensure at 6:25 AM the morning of surgery. Drink in one sitting. Do not sip.  This drink was given to you during your hospital  pre-op appointment visit. Nothing else to drink after completing the  Pre-Surgery Clear Ensure.          If you have questions, please contact your surgeon's office.   FOLLOW BOWEL PREP AND ANY ADDITIONAL PRE OP INSTRUCTIONS YOU RECEIVED  FROM YOUR SURGEON'S OFFICE!!!     Oral Hygiene is also important to reduce your risk of infection.                                    Remember - BRUSH YOUR TEETH THE MORNING OF SURGERY WITH YOUR REGULAR TOOTHPASTE   Take these medicines the morning of surgery with A SIP OF WATER: Amlodipine, Amiodarone, Gabapentin, Metoprolol, Oxycodone These are anesthesia recommendations for holding your anticoagulants.  Please contact your prescribing physician to confirm IF it is safe to hold your anticoagulants for this length of time.   Eliquis Apixaban   72 hours   Xarelto Rivaroxaban   72 hours  Plavix Clopidogrel   120 hours  Pletal Cilostazol   120 hours                                 You may not have any metal on your body including jewelry, and body piercing             Do not wear lotions, powders, cologne, or deodorant              Men may shave face and neck.   Do not bring valuables to the hospital. [facility] IS NOT             RESPONSIBLE   FOR VALUABLES.   Bring small overnight bag day of surgery.               Please read over the following fact sheets you were given: IF YOU HAVE QUESTIONS ABOUT YOUR PRE-OP INSTRUCTIONS PLEASE CALL [phone]- [facility] - Preparing for Surgery Before surgery, you can play an important role.  Because skin is not sterile, your skin needs to be as free of germs as possible.  You can reduce the number of germs on your skin by washing with CHG (chlorahexidine gluconate) soap before surgery.  CHG is an antiseptic cleaner which kills germs and bonds with the skin to continue killing germs even after washing. Please DO NOT use if you have an allergy to CHG or antibacterial soaps.  If your skin becomes reddened/irritated stop using the CHG and inform your nurse when you arrive at Short Stay. Do not shave (including legs and underarms) for at least 48 hours prior to the first CHG shower.  You may shave your face/neck.  Please follow these  instructions carefully:  1.  Shower with CHG Soap the night before surgery and the  morning of surgery.  2.  If you choose to wash your hair, wash your hair first as usual with your normal  shampoo.  3.  After you shampoo, rinse your hair and body thoroughly to remove the shampoo.                             4.  Use CHG as you would any other liquid soap.  You can apply chg directly to the skin and wash.  Gently with a scrungie or clean washcloth.  5.  Apply the CHG Soap to your body ONLY FROM THE NECK DOWN.   Do   not use on face/ open                           Wound or open sores. Avoid contact with eyes, ears mouth and   genitals (private parts).                       Wash face,  Genitals (private parts) with your normal soap.             6.  Wash thoroughly, paying special attention to the area where your    surgery  will be performed.  7.  Thoroughly rinse your  body with warm water from the neck down.  8.  DO NOT shower/wash with your normal soap after using and rinsing off the CHG Soap.                9.  Pat yourself dry with a clean towel.            10.  Wear clean pajamas.            11.  Place clean sheets on your bed the night of your first shower and do not  sleep with pets. Day of Surgery : Do not apply any lotions/deodorants the morning of surgery.  Please wear clean clothes to the hospital/surgery center.  FAILURE TO FOLLOW THESE INSTRUCTIONS MAY RESULT IN THE CANCELLATION OF YOUR SURGERY  PATIENT SIGNATURE_________________________________  NURSE SIGNATURE__________________________________  ________________________________________________________________________   Jeffery White  An incentive spirometer is a tool that can help keep your lungs clear and active. This tool measures how well you are filling your lungs with each breath. Taking long deep breaths may help reverse or decrease the chance of developing breathing (pulmonary) problems (especially infection)  following: A long period of time when you are unable to move or be active. BEFORE THE PROCEDURE  If the spirometer includes an indicator to show your best effort, your nurse or respiratory therapist will set it to a desired goal. If possible, sit up straight or lean slightly forward. Try not to slouch. Hold the incentive spirometer in an upright position. INSTRUCTIONS FOR USE  Sit on the edge of your bed if possible, or sit up as far as you can in bed or on a chair. Hold the incentive spirometer in an upright position. Breathe out normally. Place the mouthpiece in your mouth and seal your lips tightly around it. Breathe in slowly and as deeply as possible, raising the piston or the ball toward the top of the column. Hold your breath for 3-5 seconds or for as long as possible. Allow the piston or ball to fall to the bottom of the column. Remove the mouthpiece from your mouth and breathe out normally. Rest for a few seconds and repeat Steps 1 through 7 at least 10 times every 1-2 hours when you are awake. Take your time and take a few normal breaths between deep breaths. The spirometer may include an indicator to show your best effort. Use the indicator as a goal to work toward during each repetition. After each set of 10 deep breaths, practice coughing to be sure your lungs are clear. If you have an incision (the cut made at the time of surgery), support your incision when coughing by placing a pillow or rolled up towels firmly against it. Once you are able to get out of bed, walk around indoors and cough well. You may stop using the incentive spirometer when instructed by your caregiver.  RISKS AND COMPLICATIONS Take your time so you do not get dizzy or light-headed. If you are in pain, you may need to take or ask for pain medication before doing incentive spirometry. It is harder to take a deep breath if you are having pain. AFTER USE Rest and breathe slowly and easily. It can be helpful to  keep track of a log of your progress. Your caregiver can provide you with a simple table to help with this. If you are using the spirometer at home, follow these instructions: SEEK MEDICAL CARE IF:  You are having difficultly using the spirometer. You have trouble  using the spirometer as often as instructed. Your pain medication is not giving enough relief while using the spirometer. You develop fever of 100.5 F (38.1 C) or higher. SEEK IMMEDIATE MEDICAL CARE IF:  You cough up bloody sputum that had not been present before. You develop fever of 102 F (38.9 C) or greater. You develop worsening pain at or near the incision site. MAKE SURE YOU:  Understand these instructions. Will watch your condition. Will get help right away if you are not doing well or get worse. Document Released: 07/18/2006 Document Revised: 05/30/2011 Document Reviewed: 09/18/2006 ExitCare Patient Information 2014 ExitCare, [place].   ________________________________________________________________________  WHAT IS A BLOOD TRANSFUSION? Blood Transfusion Information  A transfusion is the replacement of blood or some of its parts. Blood is made up of multiple cells which provide different functions. Red blood cells carry oxygen and are used for blood loss replacement. White blood cells fight against infection. Platelets control bleeding. Plasma helps clot blood. Other blood products are available for specialized needs, such as hemophilia or other clotting disorders. BEFORE THE TRANSFUSION  Who gives blood for transfusions?  Healthy volunteers who are fully evaluated to make sure their blood is safe. This is blood bank blood. Transfusion therapy is the safest it has ever been in the practice of medicine. Before blood is taken from a donor, a complete history is taken to make sure that person has no history of diseases nor engages in risky social behavior (examples are intravenous drug use or sexual activity with  multiple partners). The donor's travel history is screened to minimize risk of transmitting infections, such as malaria. The donated blood is tested for signs of infectious diseases, such as HIV and hepatitis. The blood is then tested to be sure it is compatible with you in order to minimize the chance of a transfusion reaction. If you or a relative donates blood, this is often done in anticipation of surgery and is not appropriate for emergency situations. It takes many days to process the donated blood. RISKS AND COMPLICATIONS Although transfusion therapy is very safe and saves many lives, the main dangers of transfusion include:  Getting an infectious disease. Developing a transfusion reaction. This is an allergic reaction to something in the blood you were given. Every precaution is taken to prevent this. The decision to have a blood transfusion has been considered carefully by your caregiver before blood is given. Blood is not given unless the benefits outweigh the risks. AFTER THE TRANSFUSION Right after receiving a blood transfusion, you will usually feel much better and more energetic. This is especially true if your red blood cells have gotten low (anemic). The transfusion raises the level of the red blood cells which carry oxygen, and this usually causes an energy increase. The nurse administering the transfusion will monitor you carefully for complications. HOME CARE INSTRUCTIONS  No special instructions are needed after a transfusion. You may find your energy is better. Speak with your caregiver about any limitations on activity for underlying diseases you may have. SEEK MEDICAL CARE IF:  Your condition is not improving after your transfusion. You develop redness or irritation at the intravenous (IV) site. SEEK IMMEDIATE MEDICAL CARE IF:  Any of the following symptoms occur over the next 12 hours: Shaking chills. You have a temperature by mouth above 102 F (38.9 C), not controlled by  medicine. Chest, back, or muscle pain. People around you feel you are not acting correctly or are confused. Shortness of breath or difficulty  breathing. Dizziness and fainting. You get a rash or develop hives. You have a decrease in urine output. Your urine turns a dark color or changes to pink, red, or brown. Any of the following symptoms occur over the next 10 days: You have a temperature by mouth above 102 F (38.9 C), not controlled by medicine. Shortness of breath. Weakness after normal activity. The white part of the eye turns yellow (jaundice). You have a decrease in the amount of urine or are urinating less often. Your urine turns a dark color or changes to pink, red, or brown. Document Released: 03/04/2000 Document Revised: 05/30/2011 Document Reviewed: 10/22/2007 [facility] Patient Information 2014 [place], [place].  _______________________________________________________________________

## 2021-08-13 ENCOUNTER — Encounter (HOSPITAL_COMMUNITY)
Admission: RE | Admit: 2021-08-13 | Discharge: 2021-08-13 | Disposition: A | Discharge status: Home / Self Care | Payer: Medicare Other | Source: Ambulatory Visit | Attending: Orthopaedic Surgery | Admitting: Orthopaedic Surgery

## 2021-08-13 ENCOUNTER — Encounter (HOSPITAL_COMMUNITY): Payer: Self-pay

## 2021-08-13 VITALS — BP 135/96 | HR 75 | Temp 98.3°F | Resp 16 | Ht 72.0 in | Wt 228.4 lb

## 2021-08-13 DIAGNOSIS — I10 Essential (primary) hypertension: Secondary | ICD-10-CM | POA: Insufficient documentation

## 2021-08-13 DIAGNOSIS — Z01818 Encounter for other preprocedural examination: Secondary | ICD-10-CM | POA: Insufficient documentation

## 2021-08-13 DIAGNOSIS — M1612 Unilateral primary osteoarthritis, left hip: Secondary | ICD-10-CM

## 2021-08-13 DIAGNOSIS — M160 Bilateral primary osteoarthritis of hip: Secondary | ICD-10-CM | POA: Diagnosis not present

## 2021-08-13 DIAGNOSIS — I2510 Atherosclerotic heart disease of native coronary artery without angina pectoris: Secondary | ICD-10-CM | POA: Diagnosis not present

## 2021-08-13 HISTORY — DX: Cardiac murmur, unspecified: R01.1

## 2021-08-13 HISTORY — DX: Unspecified osteoarthritis, unspecified site: M19.90

## 2021-08-13 LAB — CBC
HCT: 45.7 % (ref 39.0–52.0)
Hemoglobin: 14.8 g/dL (ref 13.0–17.0)
MCH: 27.3 pg (ref 26.0–34.0)
MCHC: 32.4 g/dL (ref 30.0–36.0)
MCV: 84.2 fL (ref 80.0–100.0)
Platelets: 211 10*3/uL (ref 150–400)
RBC: 5.43 MIL/uL (ref 4.22–5.81)
RDW: 15.5 % (ref 11.5–15.5)
WBC: 3.4 10*3/uL — ABNORMAL LOW (ref 4.0–10.5)
nRBC: 0 % (ref 0.0–0.2)

## 2021-08-13 LAB — SURGICAL PCR SCREEN
MRSA, PCR: NEGATIVE
Staphylococcus aureus: NEGATIVE

## 2021-08-13 LAB — BASIC METABOLIC PANEL
Anion gap: 7 (ref 5–15)
BUN: 26 mg/dL — ABNORMAL HIGH (ref 8–23)
CO2: 29 mmol/L (ref 22–32)
Calcium: 9.3 mg/dL (ref 8.9–10.3)
Chloride: 105 mmol/L (ref 98–111)
Creatinine, Ser: 1.37 mg/dL — ABNORMAL HIGH (ref 0.61–1.24)
GFR, Estimated: 51 mL/min — ABNORMAL LOW (ref >60)
Glucose, Bld: 99 mg/dL (ref 70–99)
Potassium: 4 mmol/L (ref 3.5–5.1)
Sodium: 141 mmol/L (ref 135–145)

## 2021-08-17 ENCOUNTER — Other Ambulatory Visit: Payer: Self-pay | Admitting: Specialist

## 2021-08-17 ENCOUNTER — Encounter (HOSPITAL_COMMUNITY): Payer: Self-pay | Admitting: Anesthesiology

## 2021-08-17 ENCOUNTER — Encounter (HOSPITAL_COMMUNITY): Payer: Self-pay | Admitting: Physician Assistant

## 2021-08-17 NOTE — Progress Notes (Signed)
Anesthesia Chart Review   Case: [id number] Date/Time: 08/20/21 0911   Procedure: LEFT TOTAL HIP ARTHROPLASTY ANTERIOR APPROACH (Left: Hip)   Anesthesia type: Spinal   Pre-op diagnosis: OSTEOARTHRITIS / DEGENERATIVE JOINT DISEASE LEFT HIP   Location: [facility] 09 / [facility]   Surgeons: Kathryne Hitch, MD       DISCUSSION:88 y.o. never smoker with h/o HTN, left hip OA scheduled for above procedure 08/20/2021 with Dr. Doneen Poisson.   VS: BP (!) 135/96   Pulse 75   Temp 36.8 C (Oral)   Resp 16   Ht 6' (1.829 m)   Wt 103.6 kg   SpO2 99%   BMI 30.98 kg/m   PROVIDERS: Rinaldo Cloud, MD is PCP    LABS: Labs reviewed: Acceptable for surgery. (all labs ordered are listed, but only abnormal results are displayed)  Labs Reviewed  BASIC METABOLIC PANEL - Abnormal; Notable for the following components:      Result Value   BUN 26 (*)    Creatinine, Ser 1.37 (*)    GFR, Estimated 51 (*)    All other components within normal limits  CBC - Abnormal; Notable for the following components:   WBC 3.4 (*)    All other components within normal limits  SURGICAL PCR SCREEN  TYPE AND SCREEN     IMAGES:   EKG:   CV:  Past Medical History:  Diagnosis Date   Arthritis    Chronic back pain    Gout    Heart murmur    Hypercholesteremia    Hypertension     Past Surgical History:  Procedure Laterality Date   COLONOSCOPY     x2   TOTAL HIP ARTHROPLASTY Right 04/14/2017   Procedure: RIGHT TOTAL HIP ARTHROPLASTY ANTERIOR APPROACH;  Surgeon: Kathryne Hitch, MD;  Location: [facility];  Service: Orthopedics;  Laterality: Right;    MEDICATIONS:  amiodarone (PACERONE) 200 MG tablet   amLODipine (NORVASC) 5 MG tablet   diclofenac Sodium (VOLTAREN) 1 % GEL   ELIQUIS 5 MG TABS tablet   hydrochlorothiazide (HYDRODIURIL) 25 MG tablet   ketoconazole (NIZORAL) 2 % cream   linaclotide (LINZESS) 72 MCG capsule   losartan-hydrochlorothiazide (HYZAAR) 100-12.5 MG tablet    metoprolol succinate (TOPROL-XL) 25 MG 24 hr tablet   naproxen (NAPROSYN) 500 MG tablet   oxyCODONE-acetaminophen (PERCOCET) 10-325 MG tablet   aspirin 81 MG chewable tablet   atenolol (TENORMIN) 50 MG tablet   colchicine 0.6 MG tablet   Cyanocobalamin (VITAMIN B-12 PO)   fenofibrate (TRICOR) 48 MG tablet   gabapentin (NEURONTIN) 300 MG capsule   ibuprofen (ADVIL,MOTRIN) 800 MG tablet   losartan (COZAAR) 50 MG tablet   meloxicam (MOBIC) 15 MG tablet   omeprazole (PRILOSEC) 40 MG capsule   rosuvastatin (CRESTOR) 10 MG tablet   No current facility-administered medications for this encounter.

## 2021-08-19 ENCOUNTER — Encounter (HOSPITAL_COMMUNITY): Payer: Self-pay | Admitting: Orthopaedic Surgery

## 2021-08-20 ENCOUNTER — Ambulatory Visit (HOSPITAL_COMMUNITY): Admission: RE | Admit: 2021-08-20 | Payer: Medicare Other | Source: Ambulatory Visit | Admitting: Orthopaedic Surgery

## 2021-08-20 ENCOUNTER — Encounter (HOSPITAL_COMMUNITY): Admission: RE | Payer: Self-pay | Source: Ambulatory Visit

## 2021-08-20 DIAGNOSIS — I2510 Atherosclerotic heart disease of native coronary artery without angina pectoris: Secondary | ICD-10-CM

## 2021-08-20 LAB — TYPE AND SCREEN
ABO/RH(D): A POS
Antibody Screen: NEGATIVE

## 2021-08-20 SURGERY — ARTHROPLASTY, HIP, TOTAL, ANTERIOR APPROACH
Anesthesia: Spinal | Site: Hip | Laterality: Left

## 2021-09-02 ENCOUNTER — Encounter: Payer: Medicare Other | Admitting: Orthopaedic Surgery

## 2021-09-14 ENCOUNTER — Telehealth: Payer: Self-pay

## 2021-09-14 NOTE — Telephone Encounter (Signed)
Pt called and states that he was scheduled for a left hip replacement but had to cancel because of a medication that he was on?  He states that this has been completed and that he would like to move forward with scheduling surgery? Cb [phone] would like to set this back up.

## 2021-09-17 NOTE — Telephone Encounter (Signed)
I called patient yesterday.  He went to see cardiologist this past Monday.  He was unsure of physician's name but he went to [facility] on [address].  I called [facility] and was told patient saw Dr. Mercy Riding.  I faxed clearance request to Dr. Mercy Riding.  Once received, I will reschedule patient.

## 2021-09-25 ENCOUNTER — Other Ambulatory Visit: Payer: Self-pay | Admitting: Specialist

## 2021-10-05 ENCOUNTER — Other Ambulatory Visit: Payer: Self-pay

## 2021-10-20 ENCOUNTER — Other Ambulatory Visit: Payer: Self-pay | Admitting: Physician Assistant

## 2021-10-20 DIAGNOSIS — M1612 Unilateral primary osteoarthritis, left hip: Secondary | ICD-10-CM

## 2021-10-25 NOTE — Progress Notes (Signed)
COVID Vaccine Completed: yes x2  Date of COVID positive in last 90 days:  PCP - Rinaldo Cloud, MD Cardiologist -   Chest x-ray -  EKG -  Stress Test - 2006 ECHO -  Cardiac Cath -  Pacemaker/ICD device last checked: Spinal Cord Stimulator:  Bowel Prep -   Sleep Study -  CPAP -   Fasting Blood Sugar -  Checks Blood Sugar _____ times a day  Blood Thinner Instructions: Eliquis?? Aspirin Instructions: ASA 81?? Last Dose:  Activity level:  Can go up a flight of stairs and perform activities of daily living without stopping and without symptoms of chest pain or shortness of breath.  Able to exercise without symptoms  Unable to go up a flight of stairs without symptoms of     Anesthesia review: a fib, HTN  Patient denies shortness of breath, fever, cough and chest pain at PAT appointment  Patient verbalized understanding of instructions that were given to them at the PAT appointment. Patient was also instructed that they will need to review over the PAT instructions again at home before surgery.

## 2021-10-25 NOTE — Patient Instructions (Signed)
SURGICAL WAITING ROOM VISITATION Patients having surgery or a procedure may have no more than 2 support people in the waiting area - these visitors may rotate.   Children under the age of 69 must have an adult with them who is not the patient. If the patient needs to stay at the hospital during part of their recovery, the visitor guidelines for inpatient rooms apply. Pre-op nurse will coordinate an appropriate time for 1 support person to accompany patient in pre-op.  This support person may not rotate.    Please refer to the [facility] website for the visitor guidelines for Inpatients (after your surgery is over and you are in a regular room).    Your procedure is scheduled on: 11/05/21   Report to [facility] Main Entrance    Report to admitting at 7:45 AM   Call this number if you have problems the morning of surgery [phone]   Do not eat food :After Midnight.   After Midnight you may have the following liquids until 7:15 AM DAY OF SURGERY  Water Non-Citrus Juices (without pulp, NO RED) Carbonated Beverages Black Coffee (NO MILK/CREAM OR CREAMERS, sugar ok)  Clear Tea (NO MILK/CREAM OR CREAMERS, sugar ok) regular and decaf                             Plain Jell-O (NO RED)                                           Fruit ices (not with fruit pulp, NO RED)                                     Popsicles (NO RED)                                                               Sports drinks like Gatorade (NO RED)     The day of surgery:  Drink ONE (1) Pre-Surgery Clear Ensure at 7;15 AM the morning of surgery. Drink in one sitting. Do not sip.  This drink was given to you during your hospital  pre-op appointment visit. Nothing else to drink after completing the  Pre-Surgery Clear Ensure.          If you have questions, please contact your surgeon's office.   FOLLOW BOWEL PREP AND ANY ADDITIONAL PRE OP INSTRUCTIONS YOU RECEIVED FROM YOUR SURGEON'S OFFICE!!!     Oral  Hygiene is also important to reduce your risk of infection.                                    Remember - BRUSH YOUR TEETH THE MORNING OF SURGERY WITH YOUR REGULAR TOOTHPASTE   Do NOT smoke after Midnight   Take these medicines the morning of surgery with A SIP OF WATER:   DO NOT TAKE ANY ORAL DIABETIC MEDICATIONS DAY OF YOUR SURGERY  Bring CPAP mask and tubing day of surgery.  You may not have any metal on your body including jewelry, and body piercing             Do not wear lotions, powders, cologne, or deodorant              Men may shave face and neck.   Do not bring valuables to the hospital. [facility] IS NOT             RESPONSIBLE   FOR VALUABLES.   Contacts, dentures or bridgework may not be worn into surgery.   Bring small overnight bag day of surgery.   DO NOT BRING YOUR HOME MEDICATIONS TO THE HOSPITAL. PHARMACY WILL DISPENSE MEDICATIONS LISTED ON YOUR MEDICATION LIST TO YOU DURING YOUR ADMISSION IN THE HOSPITAL!    Special Instructions: Bring a copy of your healthcare power of attorney and living will documents         the day of surgery if you haven't scanned them before.              Please read over the following fact sheets you were given: IF YOU HAVE QUESTIONS ABOUT YOUR PRE-OP INSTRUCTIONS PLEASE CALL [phone]- [facility] - Preparing for Surgery Before surgery, you can play an important role.  Because skin is not sterile, your skin needs to be as free of germs as possible.  You can reduce the number of germs on your skin by washing with CHG (chlorahexidine gluconate) soap before surgery.  CHG is an antiseptic cleaner which kills germs and bonds with the skin to continue killing germs even after washing. Please DO NOT use if you have an allergy to CHG or antibacterial soaps.  If your skin becomes reddened/irritated stop using the CHG and inform your nurse when you arrive at Short Stay. Do not shave (including legs and  underarms) for at least 48 hours prior to the first CHG shower.  You may shave your face/neck.  Please follow these instructions carefully:  1.  Shower with CHG Soap the night before surgery and the  morning of surgery.  2.  If you choose to wash your hair, wash your hair first as usual with your normal  shampoo.  3.  After you shampoo, rinse your hair and body thoroughly to remove the shampoo.                             4.  Use CHG as you would any other liquid soap.  You can apply chg directly to the skin and wash.  Gently with a scrungie or clean washcloth.  5.  Apply the CHG Soap to your body ONLY FROM THE NECK DOWN.   Do   not use on face/ open                           Wound or open sores. Avoid contact with eyes, ears mouth and   genitals (private parts).                       Wash face,  Genitals (private parts) with your normal soap.             6.  Wash thoroughly, paying special attention to the area where your    surgery  will be performed.  7.  Thoroughly rinse your body with warm water from the neck  down.  8.  DO NOT shower/wash with your normal soap after using and rinsing off the CHG Soap.                9.  Pat yourself dry with a clean towel.            10.  Wear clean pajamas.            11.  Place clean sheets on your bed the night of your first shower and do not  sleep with pets. Day of Surgery : Do not apply any lotions/deodorants the morning of surgery.  Please wear clean clothes to the hospital/surgery center.  FAILURE TO FOLLOW THESE INSTRUCTIONS MAY RESULT IN THE CANCELLATION OF YOUR SURGERY  PATIENT SIGNATURE_________________________________  NURSE SIGNATURE__________________________________  ________________________________________________________________________   Jeffery White  An incentive spirometer is a tool that can help keep your lungs clear and active. This tool measures how well you are filling your lungs with each breath. Taking long deep  breaths may help reverse or decrease the chance of developing breathing (pulmonary) problems (especially infection) following: A long period of time when you are unable to move or be active. BEFORE THE PROCEDURE  If the spirometer includes an indicator to show your best effort, your nurse or respiratory therapist will set it to a desired goal. If possible, sit up straight or lean slightly forward. Try not to slouch. Hold the incentive spirometer in an upright position. INSTRUCTIONS FOR USE  Sit on the edge of your bed if possible, or sit up as far as you can in bed or on a chair. Hold the incentive spirometer in an upright position. Breathe out normally. Place the mouthpiece in your mouth and seal your lips tightly around it. Breathe in slowly and as deeply as possible, raising the piston or the ball toward the top of the column. Hold your breath for 3-5 seconds or for as long as possible. Allow the piston or ball to fall to the bottom of the column. Remove the mouthpiece from your mouth and breathe out normally. Rest for a few seconds and repeat Steps 1 through 7 at least 10 times every 1-2 hours when you are awake. Take your time and take a few normal breaths between deep breaths. The spirometer may include an indicator to show your best effort. Use the indicator as a goal to work toward during each repetition. After each set of 10 deep breaths, practice coughing to be sure your lungs are clear. If you have an incision (the cut made at the time of surgery), support your incision when coughing by placing a pillow or rolled up towels firmly against it. Once you are able to get out of bed, walk around indoors and cough well. You may stop using the incentive spirometer when instructed by your caregiver.  RISKS AND COMPLICATIONS Take your time so you do not get dizzy or light-headed. If you are in pain, you may need to take or ask for pain medication before doing incentive spirometry. It is harder  to take a deep breath if you are having pain. AFTER USE Rest and breathe slowly and easily. It can be helpful to keep track of a log of your progress. Your caregiver can provide you with a simple table to help with this. If you are using the spirometer at home, follow these instructions: SEEK MEDICAL CARE IF:  You are having difficultly using the spirometer. You have trouble using the spirometer as often as instructed.  Your pain medication is not giving enough relief while using the spirometer. You develop fever of 100.5 F (38.1 C) or higher. SEEK IMMEDIATE MEDICAL CARE IF:  You cough up bloody sputum that had not been present before. You develop fever of 102 F (38.9 C) or greater. You develop worsening pain at or near the incision site. MAKE SURE YOU:  Understand these instructions. Will watch your condition. Will get help right away if you are not doing well or get worse. Document Released: 07/18/2006 Document Revised: 05/30/2011 Document Reviewed: 09/18/2006 Exit[employer] Patient Information 2014 Exit[employer], [place].   ________________________________________________________________________  WHAT IS A BLOOD TRANSFUSION? Blood Transfusion Information  A transfusion is the replacement of blood or some of its parts. Blood is made up of multiple cells which provide different functions. Red blood cells carry oxygen and are used for blood loss replacement. White blood cells fight against infection. Platelets control bleeding. Plasma helps clot blood. Other blood products are available for specialized needs, such as hemophilia or other clotting disorders. BEFORE THE TRANSFUSION  Who gives blood for transfusions?  Healthy volunteers who are fully evaluated to make sure their blood is safe. This is blood bank blood. Transfusion therapy is the safest it has ever been in the practice of medicine. Before blood is taken from a donor, a complete history is taken to make sure that person has no  history of diseases nor engages in risky social behavior (examples are intravenous drug use or sexual activity with multiple partners). The donor's travel history is screened to minimize risk of transmitting infections, such as malaria. The donated blood is tested for signs of infectious diseases, such as HIV and hepatitis. The blood is then tested to be sure it is compatible with you in order to minimize the chance of a transfusion reaction. If you or a relative donates blood, this is often done in anticipation of surgery and is not appropriate for emergency situations. It takes many days to process the donated blood. RISKS AND COMPLICATIONS Although transfusion therapy is very safe and saves many lives, the main dangers of transfusion include:  Getting an infectious disease. Developing a transfusion reaction. This is an allergic reaction to something in the blood you were given. Every precaution is taken to prevent this. The decision to have a blood transfusion has been considered carefully by your caregiver before blood is given. Blood is not given unless the benefits outweigh the risks. AFTER THE TRANSFUSION Right after receiving a blood transfusion, you will usually feel much better and more energetic. This is especially true if your red blood cells have gotten low (anemic). The transfusion raises the level of the red blood cells which carry oxygen, and this usually causes an energy increase. The nurse administering the transfusion will monitor you carefully for complications. HOME CARE INSTRUCTIONS  No special instructions are needed after a transfusion. You may find your energy is better. Speak with your caregiver about any limitations on activity for underlying diseases you may have. SEEK MEDICAL CARE IF:  Your condition is not improving after your transfusion. You develop redness or irritation at the intravenous (IV) site. SEEK IMMEDIATE MEDICAL CARE IF:  Any of the following symptoms occur  over the next 12 hours: Shaking chills. You have a temperature by mouth above 102 F (38.9 C), not controlled by medicine. Chest, back, or muscle pain. People around you feel you are not acting correctly or are confused. Shortness of breath or difficulty breathing. Dizziness and fainting. You get a  rash or develop hives. You have a decrease in urine output. Your urine turns a dark color or changes to pink, red, or brown. Any of the following symptoms occur over the next 10 days: You have a temperature by mouth above 102 F (38.9 C), not controlled by medicine. Shortness of breath. Weakness after normal activity. The white part of the eye turns yellow (jaundice). You have a decrease in the amount of urine or are urinating less often. Your urine turns a dark color or changes to pink, red, or brown. Document Released: 03/04/2000 Document Revised: 05/30/2011 Document Reviewed: 10/22/2007 [facility] Patient Information 2014 [place], [place].  _______________________________________________________________________

## 2021-10-27 ENCOUNTER — Encounter (HOSPITAL_COMMUNITY): Payer: Self-pay

## 2021-10-27 ENCOUNTER — Encounter (HOSPITAL_COMMUNITY)
Admission: RE | Admit: 2021-10-27 | Discharge: 2021-10-27 | Disposition: A | Discharge status: Home / Self Care | Payer: Medicare Other | Source: Ambulatory Visit | Attending: Orthopaedic Surgery | Admitting: Orthopaedic Surgery

## 2021-10-27 VITALS — BP 151/77 | HR 50 | Temp 98.1°F | Resp 14 | Ht 72.0 in | Wt 227.1 lb

## 2021-10-27 DIAGNOSIS — I2510 Atherosclerotic heart disease of native coronary artery without angina pectoris: Secondary | ICD-10-CM | POA: Insufficient documentation

## 2021-10-27 DIAGNOSIS — Z01812 Encounter for preprocedural laboratory examination: Secondary | ICD-10-CM | POA: Insufficient documentation

## 2021-10-27 DIAGNOSIS — Z01818 Encounter for other preprocedural examination: Secondary | ICD-10-CM

## 2021-10-27 DIAGNOSIS — M1612 Unilateral primary osteoarthritis, left hip: Secondary | ICD-10-CM | POA: Diagnosis not present

## 2021-10-27 HISTORY — DX: Unspecified atrial flutter: I48.92

## 2021-10-27 LAB — BASIC METABOLIC PANEL
Anion gap: 7 (ref 5–15)
BUN: 24 mg/dL — ABNORMAL HIGH (ref 8–23)
CO2: 26 mmol/L (ref 22–32)
Calcium: 9.3 mg/dL (ref 8.9–10.3)
Chloride: 107 mmol/L (ref 98–111)
Creatinine, Ser: 1.23 mg/dL (ref 0.61–1.24)
GFR, Estimated: 58 mL/min — ABNORMAL LOW (ref >60)
Glucose, Bld: 94 mg/dL (ref 70–99)
Potassium: 4.2 mmol/L (ref 3.5–5.1)
Sodium: 140 mmol/L (ref 135–145)

## 2021-10-27 LAB — CBC
HCT: 42.8 % (ref 39.0–52.0)
Hemoglobin: 13.8 g/dL (ref 13.0–17.0)
MCH: 27.3 pg (ref 26.0–34.0)
MCHC: 32.2 g/dL (ref 30.0–36.0)
MCV: 84.6 fL (ref 80.0–100.0)
Platelets: 187 10*3/uL (ref 150–400)
RBC: 5.06 MIL/uL (ref 4.22–5.81)
RDW: 16.5 % — ABNORMAL HIGH (ref 11.5–15.5)
WBC: 3.8 10*3/uL — ABNORMAL LOW (ref 4.0–10.5)
nRBC: 0 % (ref 0.0–0.2)

## 2021-10-27 LAB — SURGICAL PCR SCREEN
MRSA, PCR: NEGATIVE
Staphylococcus aureus: NEGATIVE

## 2021-10-27 NOTE — Progress Notes (Signed)
Attempted to call pharmacy x4 times during appointment with no answer. Provided patient number to call with instructions to call as soon as he gets home. Patient verbalized understanding.

## 2021-10-28 ENCOUNTER — Encounter (HOSPITAL_COMMUNITY): Payer: Self-pay

## 2021-10-28 NOTE — Progress Notes (Signed)
Anesthesia Chart Review:   Case: [id number] Date/Time: 11/05/21 1000   Procedure: LEFT TOTAL HIP ARTHROPLASTY ANTERIOR APPROACH (Left: Hip)   Anesthesia type: Spinal   Pre-op diagnosis: OSTEOARTHRITIS / DEGENERATIVE JOINT DISEASE LEFT HIP   Location: [facility] ROOM 09 / [facility]   Surgeons: Kathryne Hitch, MD       DISCUSSION: Pt is 88 years old with hx atrial flutter, HTN   Pt to hold eliquis 3 days before surgery  VS: BP (!) 151/77   Pulse (!) 50   Temp 36.7 C (Oral)   Resp 14   Ht 6' (1.829 m)   Wt 103 kg   SpO2 100%   BMI 30.80 kg/m   PROVIDERS: - PCP is Rinaldo Cloud, MD - Cardiologist is Rosita Kea, MD at [facility] who cleared pt for surgery. Last office visit 09/13/21  LABS: Labs reviewed: Acceptable for surgery. (all labs ordered are listed, but only abnormal results are displayed)  Labs Reviewed  BASIC METABOLIC PANEL - Abnormal; Notable for the following components:      Result Value   BUN 24 (*)    GFR, Estimated 58 (*)    All other components within normal limits  CBC - Abnormal; Notable for the following components:   WBC 3.8 (*)    RDW 16.5 (*)    All other components within normal limits  SURGICAL PCR SCREEN  TYPE AND SCREEN     EKG 06/28/21: atrial flutter with controlled ventricular rate   CV: Echo 09/06/21 [facility]):  Rhythm appears to be atrial flutter Technically difficult study with suboptimal parasternal windows Mild asymmetric hypertrophy with septal thickness IVS 34mm, PWT 6mm Overall LV systolic function is normal, EF 60-65% Diastolic filling pattern is indeterminate Mild MR LA appears at least mildly dilated; LA volume 70 mL, quantifies as 23 mL/m2 RV is nor clearly visualized, appears normal in size and function Mild pulmonary arterial systolic hypertension; RVSP Non complex (<62mm), atherosclerotic plaque in the transverse aorta IVC not visualized   Past Medical History:  Diagnosis Date    Arthritis    Atrial flutter (HCC)    Chronic back pain    Gout    Heart murmur    Hypercholesteremia    Hypertension     Past Surgical History:  Procedure Laterality Date   COLONOSCOPY     x2   TOTAL HIP ARTHROPLASTY Right 04/14/2017   Procedure: RIGHT TOTAL HIP ARTHROPLASTY ANTERIOR APPROACH;  Surgeon: Kathryne Hitch, MD;  Location: [facility];  Service: Orthopedics;  Laterality: Right;    MEDICATIONS:  amiodarone (PACERONE) 200 MG tablet   amLODipine (NORVASC) 5 MG tablet   aspirin 81 MG chewable tablet   atenolol (TENORMIN) 50 MG tablet   colchicine 0.6 MG tablet   Cyanocobalamin (VITAMIN B-12 PO)   diclofenac Sodium (VOLTAREN) 1 % GEL   ELIQUIS 5 MG TABS tablet   fenofibrate (TRICOR) 48 MG tablet   gabapentin (NEURONTIN) 300 MG capsule   hydrochlorothiazide (HYDRODIURIL) 25 MG tablet   ibuprofen (ADVIL,MOTRIN) 800 MG tablet   ketoconazole (NIZORAL) 2 % cream   linaclotide (LINZESS) 72 MCG capsule   losartan (COZAAR) 50 MG tablet   losartan-hydrochlorothiazide (HYZAAR) 100-12.5 MG tablet   meloxicam (MOBIC) 15 MG tablet   metoprolol succinate (TOPROL-XL) 25 MG 24 hr tablet   naproxen (NAPROSYN) 500 MG tablet   omeprazole (PRILOSEC) 40 MG capsule   oxyCODONE-acetaminophen (PERCOCET) 10-325 MG tablet   rosuvastatin (CRESTOR) 10 MG tablet  No current facility-administered medications for this encounter.    If no changes, I anticipate pt can proceed with surgery as scheduled.   Rica Mast, PhD, FNP-BC [facility] Surgical [facility]/Anesthesiology Phone: [phone] 10/29/2021 3:02 PM

## 2021-10-28 NOTE — Progress Notes (Signed)
Spoke with patient to remind him to call pharmacy to complete med rec. Also confirmed with him and his caregiver that Eliquis is being held 3 days. Last dose 11/01/21.

## 2021-10-29 NOTE — Anesthesia Preprocedure Evaluation (Addendum)
Anesthesia Evaluation  Patient identified by MRN, date of birth, ID band Patient awake    Reviewed: Allergy & Precautions, NPO status , Patient's Chart, lab work & pertinent test results  Airway Mallampati: II  TM Distance: >3 FB Neck ROM: Full    Dental  (+) Missing, Edentulous Upper   Pulmonary neg pulmonary ROS,    Pulmonary exam normal        Cardiovascular hypertension, Pt. on medications and Pt. on home beta blockers Normal cardiovascular exam+ dysrhythmias Atrial Fibrillation      Neuro/Psych negative neurological ROS  negative psych ROS   GI/Hepatic negative GI ROS, Neg liver ROS,   Endo/Other  negative endocrine ROS  Renal/GU negative Renal ROS     Musculoskeletal  (+) Arthritis ,   Abdominal   Peds  Hematology  (+) Blood dyscrasia, ,   Anesthesia Other Findings OSTEOARTHRITIS  DEGENERATIVE JOINT DISEASE LEFT HIP  Reproductive/Obstetrics                           Anesthesia Physical Anesthesia Plan  ASA: 3  Anesthesia Plan: Spinal   Post-op Pain Management:    Induction: Intravenous  PONV Risk Score and Plan: 1 and Ondansetron, Dexamethasone, Propofol infusion and Treatment may vary due to age or medical condition  Airway Management Planned: Simple Face Mask  Additional Equipment:   Intra-op Plan:   Post-operative Plan:   Informed Consent: I have reviewed the patients History and Physical, chart, labs and discussed the procedure including the risks, benefits and alternatives for the proposed anesthesia with the patient or authorized representative who has indicated his/her understanding and acceptance.     Dental advisory given  Plan Discussed with: CRNA  Anesthesia Plan Comments: (APP note by Joslyn Hy, FNP )       Anesthesia Quick Evaluation

## 2021-11-04 NOTE — H&P (Signed)
TOTAL HIP ADMISSION H&P  Patient is admitted for left total hip arthroplasty.  Subjective:  Chief Complaint: left hip pain  HPI: Jeffery White, 88 y.o. male, has a history of pain and functional disability in the left hip(s) due to arthritis and patient has failed non-surgical conservative treatments for greater than 12 weeks to include NSAID's and/or analgesics, flexibility and strengthening excercises, use of assistive devices, and activity modification.  Onset of symptoms was gradual starting 2 years ago with gradually worsening course since that time.The patient noted no past surgery on the left hip(s).  Patient currently rates pain in the left hip at 10 out of 10 with activity. Patient has night pain, worsening of pain with activity and weight bearing, pain that interfers with activities of daily living, and pain with passive range of motion. Patient has evidence of subchondral sclerosis, periarticular osteophytes, and joint space narrowing by imaging studies. This condition presents safety issues increasing the risk of falls.  There is no current active infection.  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   Current Outpatient Medications  Medication Sig Dispense Refill Last Dose   amiodarone  (PACERONE) 200 MG tablet Take 100 mg by mouth daily.      amLODipine (NORVASC) 5 MG tablet Take 5 mg by mouth daily.        ELIQUIS 5 MG TABS tablet Take 5 mg by mouth 2 (two) times daily.      losartan-hydrochlorothiazide (HYZAAR) 100-12.5 MG tablet Take 1 tablet by mouth daily.      metoprolol succinate (TOPROL-XL) 25 MG 24 hr tablet Take 25 mg by mouth daily.      naproxen (NAPROSYN) 500 MG tablet Take 500 mg by mouth 2 (two) times daily as needed (pain).      No Known Allergies  Social History   Tobacco Use   Smoking status: Never   Smokeless tobacco: Never  Substance Use Topics   Alcohol use: No    No family history on file.   Review of Systems  All other systems reviewed and are negative.   Objective:  Physical Exam Vitals reviewed.  Constitutional:      Appearance: Normal appearance.  HENT:     Head: Normocephalic and atraumatic.  Eyes:     Extraocular Movements: Extraocular movements intact.     Pupils: Pupils are equal, round, and reactive to light.  Cardiovascular:     Rate and Rhythm: Normal rate.     Pulses: Normal pulses.  Pulmonary:     Effort: Pulmonary effort is normal.     Breath sounds: Normal breath sounds.  Abdominal:     Palpations: Abdomen is soft.  Musculoskeletal:     Cervical back: Normal range of motion and neck supple.  Left hip: Tenderness and bony tenderness present. Decreased range of motion. Decreased strength.  Neurological:     Mental Status: He is alert and oriented to person, place, and time.     Vital signs in last 24 hours:    Labs:   Estimated body mass index is 30.8 kg/m as calculated from the following:   Height as of 10/27/21: 6' (1.829 m).   Weight as of 10/27/21: 103 kg.   Imaging Review Plain radiographs demonstrate severe degenerative joint disease of the left hip(s). The bone quality appears to be good for age and reported activity level.      Assessment/Plan:  End stage arthritis, left hip(s)  The  patient history, physical examination, clinical judgement of the provider and imaging studies are consistent with end stage degenerative joint disease of the left hip(s) and total hip arthroplasty is deemed medically necessary. The treatment options including medical management, injection therapy, arthroscopy and arthroplasty were discussed at length. The risks and benefits of total hip arthroplasty were presented and reviewed. The risks due to aseptic loosening, infection, stiffness, dislocation/subluxation,  thromboembolic complications and other imponderables were discussed.  The patient acknowledged the explanation, agreed to proceed with the plan and consent was signed. Patient is being admitted for inpatient treatment for surgery, pain control, PT, OT, prophylactic antibiotics, VTE prophylaxis, progressive ambulation and ADL's and discharge planning.The patient is planning to be discharged home with home health services

## 2021-11-05 ENCOUNTER — Ambulatory Visit (HOSPITAL_BASED_OUTPATIENT_CLINIC_OR_DEPARTMENT_OTHER): Payer: Medicare Other | Admitting: Anesthesiology

## 2021-11-05 ENCOUNTER — Other Ambulatory Visit: Payer: Self-pay

## 2021-11-05 ENCOUNTER — Encounter (HOSPITAL_COMMUNITY)
Admission: RE | Disposition: A | Discharge status: Home Health | Payer: Self-pay | Source: Ambulatory Visit | Attending: Orthopaedic Surgery

## 2021-11-05 ENCOUNTER — Observation Stay (HOSPITAL_COMMUNITY)
Admission: RE | Admit: 2021-11-05 | Discharge: 2021-11-06 | Disposition: A | Discharge status: Home Health | Payer: Medicare Other | Source: Ambulatory Visit | Attending: Orthopaedic Surgery | Admitting: Orthopaedic Surgery

## 2021-11-05 ENCOUNTER — Ambulatory Visit (HOSPITAL_COMMUNITY): Payer: Medicare Other

## 2021-11-05 ENCOUNTER — Ambulatory Visit (HOSPITAL_COMMUNITY): Payer: Medicare Other | Admitting: Emergency Medicine

## 2021-11-05 ENCOUNTER — Observation Stay (HOSPITAL_COMMUNITY): Payer: Medicare Other

## 2021-11-05 ENCOUNTER — Encounter (HOSPITAL_COMMUNITY): Payer: Self-pay | Admitting: Orthopaedic Surgery

## 2021-11-05 DIAGNOSIS — M1612 Unilateral primary osteoarthritis, left hip: Secondary | ICD-10-CM

## 2021-11-05 DIAGNOSIS — Z7901 Long term (current) use of anticoagulants: Secondary | ICD-10-CM | POA: Diagnosis not present

## 2021-11-05 DIAGNOSIS — I4892 Unspecified atrial flutter: Secondary | ICD-10-CM | POA: Insufficient documentation

## 2021-11-05 DIAGNOSIS — Z96642 Presence of left artificial hip joint: Secondary | ICD-10-CM

## 2021-11-05 DIAGNOSIS — I4891 Unspecified atrial fibrillation: Secondary | ICD-10-CM

## 2021-11-05 DIAGNOSIS — I10 Essential (primary) hypertension: Secondary | ICD-10-CM | POA: Diagnosis not present

## 2021-11-05 DIAGNOSIS — Z96641 Presence of right artificial hip joint: Secondary | ICD-10-CM | POA: Diagnosis not present

## 2021-11-05 DIAGNOSIS — Z79899 Other long term (current) drug therapy: Secondary | ICD-10-CM | POA: Diagnosis not present

## 2021-11-05 HISTORY — PX: TOTAL HIP ARTHROPLASTY: SHX124

## 2021-11-05 LAB — TYPE AND SCREEN
ABO/RH(D): A POS
Antibody Screen: NEGATIVE

## 2021-11-05 SURGERY — ARTHROPLASTY, HIP, TOTAL, ANTERIOR APPROACH
Anesthesia: Spinal | Site: Hip | Laterality: Left

## 2021-11-05 MED ORDER — EPHEDRINE SULFATE-NACL 50-0.9 MG/10ML-% IV SOSY
PREFILLED_SYRINGE | INTRAVENOUS | Status: DC | PRN
  Administered 2021-11-05 (×2): 5 mg via INTRAVENOUS

## 2021-11-05 MED ORDER — SODIUM CHLORIDE 0.9 % IR SOLN
Status: DC | PRN
  Administered 2021-11-05: 1000 mL

## 2021-11-05 MED ORDER — METOPROLOL SUCCINATE ER 25 MG PO TB24
25.0000 mg | ORAL_TABLET | Freq: Every day | ORAL | Status: DC
  Administered 2021-11-06: 25 mg via ORAL
  Filled 2021-11-05: qty 1

## 2021-11-05 MED ORDER — FENTANYL CITRATE PF 50 MCG/ML IJ SOSY
25.0000 ug | PREFILLED_SYRINGE | INTRAMUSCULAR | Status: DC | PRN
  Administered 2021-11-05: 50 ug via INTRAVENOUS

## 2021-11-05 MED ORDER — MENTHOL 3 MG MT LOZG: 1.0000 | LOZENGE | OROMUCOSAL | Status: DC | PRN

## 2021-11-05 MED ORDER — POVIDONE-IODINE 10 % EX SWAB
2.0000 | Freq: Once | CUTANEOUS | Status: AC
  Administered 2021-11-05: 2 via TOPICAL

## 2021-11-05 MED ORDER — SODIUM CHLORIDE 0.9 % IV SOLN: INTRAVENOUS | Status: DC

## 2021-11-05 MED ORDER — LIDOCAINE HCL (PF) 2 % IJ SOLN
INTRAMUSCULAR | Status: AC
  Filled 2021-11-05: qty 5

## 2021-11-05 MED ORDER — PHENYLEPHRINE HCL-NACL 20-0.9 MG/250ML-% IV SOLN
INTRAVENOUS | Status: DC | PRN
  Administered 2021-11-05: 50 ug/min via INTRAVENOUS

## 2021-11-05 MED ORDER — ACETAMINOPHEN 500 MG PO TABS
ORAL_TABLET | ORAL | Status: AC
  Filled 2021-11-05: qty 2

## 2021-11-05 MED ORDER — LACTATED RINGERS IV SOLN: INTRAVENOUS | Status: DC

## 2021-11-05 MED ORDER — OXYCODONE HCL 5 MG PO TABS
5.0000 mg | ORAL_TABLET | ORAL | Status: DC | PRN
  Administered 2021-11-05 – 2021-11-06 (×5): 5 mg via ORAL
  Filled 2021-11-05 (×3): qty 1
  Filled 2021-11-05: qty 2
  Filled 2021-11-05: qty 1

## 2021-11-05 MED ORDER — FENTANYL CITRATE (PF) 100 MCG/2ML IJ SOLN
INTRAMUSCULAR | Status: AC
  Filled 2021-11-05: qty 2

## 2021-11-05 MED ORDER — PROPOFOL 10 MG/ML IV BOLUS
INTRAVENOUS | Status: AC
  Filled 2021-11-05: qty 20

## 2021-11-05 MED ORDER — TRANEXAMIC ACID-NACL 1000-0.7 MG/100ML-% IV SOLN
1000.0000 mg | INTRAVENOUS | Status: AC
  Administered 2021-11-05: 1000 mg via INTRAVENOUS
  Filled 2021-11-05: qty 100

## 2021-11-05 MED ORDER — STERILE WATER FOR IRRIGATION IR SOLN
Status: DC | PRN
  Administered 2021-11-05: 2000 mL

## 2021-11-05 MED ORDER — PHENYLEPHRINE 80 MCG/ML (10ML) SYRINGE FOR IV PUSH (FOR BLOOD PRESSURE SUPPORT)
PREFILLED_SYRINGE | INTRAVENOUS | Status: AC
  Filled 2021-11-05: qty 10

## 2021-11-05 MED ORDER — DEXAMETHASONE SODIUM PHOSPHATE 10 MG/ML IJ SOLN
INTRAMUSCULAR | Status: DC | PRN
  Administered 2021-11-05: 10 mg via INTRAVENOUS

## 2021-11-05 MED ORDER — METOCLOPRAMIDE HCL 5 MG/ML IJ SOLN: 5.0000 mg | Freq: Three times a day (TID) | INTRAMUSCULAR | Status: DC | PRN

## 2021-11-05 MED ORDER — PHENOL 1.4 % MT LIQD: 1.0000 | OROMUCOSAL | Status: DC | PRN

## 2021-11-05 MED ORDER — FENTANYL CITRATE (PF) 100 MCG/2ML IJ SOLN
INTRAMUSCULAR | Status: DC | PRN
Start: 2021-11-05 — End: 2021-11-05
  Administered 2021-11-05: 50 ug via INTRAVENOUS

## 2021-11-05 MED ORDER — ONDANSETRON HCL 4 MG/2ML IJ SOLN: 4.0000 mg | Freq: Four times a day (QID) | INTRAMUSCULAR | Status: DC | PRN

## 2021-11-05 MED ORDER — AMISULPRIDE (ANTIEMETIC) 5 MG/2ML IV SOLN: 10.0000 mg | Freq: Once | INTRAVENOUS | Status: DC | PRN

## 2021-11-05 MED ORDER — ACETAMINOPHEN 325 MG PO TABS: 325.0000 mg | ORAL_TABLET | Freq: Four times a day (QID) | ORAL | Status: DC | PRN

## 2021-11-05 MED ORDER — HYDROMORPHONE HCL 1 MG/ML IJ SOLN: 0.5000 mg | INTRAMUSCULAR | Status: DC | PRN

## 2021-11-05 MED ORDER — POLYETHYLENE GLYCOL 3350 17 G PO PACK
17.0000 g | PACK | Freq: Every day | ORAL | Status: DC | PRN
Start: 2021-11-05 — End: 2021-11-06

## 2021-11-05 MED ORDER — AMIODARONE HCL 100 MG PO TABS
100.0000 mg | ORAL_TABLET | Freq: Every day | ORAL | Status: DC
  Administered 2021-11-06: 100 mg via ORAL
  Filled 2021-11-05: qty 1

## 2021-11-05 MED ORDER — DOCUSATE SODIUM 100 MG PO CAPS
100.0000 mg | ORAL_CAPSULE | Freq: Two times a day (BID) | ORAL | Status: DC
  Administered 2021-11-05 – 2021-11-06 (×2): 100 mg via ORAL
  Filled 2021-11-05 (×2): qty 1

## 2021-11-05 MED ORDER — LOSARTAN POTASSIUM 50 MG PO TABS: 100.0000 mg | ORAL_TABLET | Freq: Every day | ORAL | Status: DC

## 2021-11-05 MED ORDER — CEFAZOLIN SODIUM-DEXTROSE 2-4 GM/100ML-% IV SOLN
2.0000 g | INTRAVENOUS | Status: AC
  Administered 2021-11-05: 2 g via INTRAVENOUS
  Filled 2021-11-05: qty 100

## 2021-11-05 MED ORDER — PROPOFOL 10 MG/ML IV BOLUS
INTRAVENOUS | Status: DC | PRN
  Administered 2021-11-05: 20 mg via INTRAVENOUS

## 2021-11-05 MED ORDER — FENTANYL CITRATE PF 50 MCG/ML IJ SOSY
PREFILLED_SYRINGE | INTRAMUSCULAR | Status: AC
  Filled 2021-11-05: qty 1

## 2021-11-05 MED ORDER — EPHEDRINE 5 MG/ML INJ
INTRAVENOUS | Status: AC
Start: 2021-11-05 — End: ?
  Filled 2021-11-05: qty 5

## 2021-11-05 MED ORDER — ONDANSETRON HCL 4 MG/2ML IJ SOLN: 4.0000 mg | Freq: Once | INTRAMUSCULAR | Status: DC | PRN

## 2021-11-05 MED ORDER — DIPHENHYDRAMINE HCL 12.5 MG/5ML PO ELIX: 12.5000 mg | ORAL_SOLUTION | ORAL | Status: DC | PRN

## 2021-11-05 MED ORDER — ONDANSETRON HCL 4 MG/2ML IJ SOLN
INTRAMUSCULAR | Status: DC | PRN
  Administered 2021-11-05: 4 mg via INTRAVENOUS

## 2021-11-05 MED ORDER — PROPOFOL 1000 MG/100ML IV EMUL
INTRAVENOUS | Status: AC
  Filled 2021-11-05: qty 100

## 2021-11-05 MED ORDER — DEXAMETHASONE SODIUM PHOSPHATE 10 MG/ML IJ SOLN
INTRAMUSCULAR | Status: AC
  Filled 2021-11-05: qty 1

## 2021-11-05 MED ORDER — CHLORHEXIDINE GLUCONATE 0.12 % MT SOLN
15.0000 mL | Freq: Once | OROMUCOSAL | Status: AC
  Administered 2021-11-05: 15 mL via OROMUCOSAL

## 2021-11-05 MED ORDER — ACETAMINOPHEN 10 MG/ML IV SOLN
INTRAVENOUS | Status: AC
  Filled 2021-11-05: qty 100

## 2021-11-05 MED ORDER — CEFAZOLIN SODIUM-DEXTROSE 1-4 GM/50ML-% IV SOLN
1.0000 g | Freq: Four times a day (QID) | INTRAVENOUS | Status: AC
  Administered 2021-11-05 (×2): 1 g via INTRAVENOUS
  Filled 2021-11-05 (×2): qty 50

## 2021-11-05 MED ORDER — AMLODIPINE BESYLATE 5 MG PO TABS
5.0000 mg | ORAL_TABLET | Freq: Every day | ORAL | Status: DC
  Administered 2021-11-06: 5 mg via ORAL
  Filled 2021-11-05: qty 1

## 2021-11-05 MED ORDER — METHOCARBAMOL 500 MG PO TABS: 500.0000 mg | ORAL_TABLET | Freq: Four times a day (QID) | ORAL | Status: DC | PRN

## 2021-11-05 MED ORDER — PROPOFOL 500 MG/50ML IV EMUL
INTRAVENOUS | Status: DC | PRN
  Administered 2021-11-05: 50 ug/kg/min via INTRAVENOUS

## 2021-11-05 MED ORDER — ORAL CARE MOUTH RINSE: 15.0000 mL | Freq: Once | OROMUCOSAL | Status: AC

## 2021-11-05 MED ORDER — GLYCOPYRROLATE 0.2 MG/ML IJ SOLN
INTRAMUSCULAR | Status: DC | PRN
  Administered 2021-11-05: .1 mg via INTRAVENOUS

## 2021-11-05 MED ORDER — MIDAZOLAM HCL 2 MG/2ML IJ SOLN
INTRAMUSCULAR | Status: AC
  Filled 2021-11-05: qty 2

## 2021-11-05 MED ORDER — 0.9 % SODIUM CHLORIDE (POUR BTL) OPTIME
TOPICAL | Status: DC | PRN
  Administered 2021-11-05: 1000 mL

## 2021-11-05 MED ORDER — METHOCARBAMOL 500 MG IVPB - SIMPLE MED
INTRAVENOUS | Status: AC
  Filled 2021-11-05: qty 55

## 2021-11-05 MED ORDER — ACETAMINOPHEN 10 MG/ML IV SOLN
1000.0000 mg | Freq: Once | INTRAVENOUS | Status: DC | PRN
  Administered 2021-11-05: 1000 mg via INTRAVENOUS

## 2021-11-05 MED ORDER — ONDANSETRON HCL 4 MG PO TABS: 4.0000 mg | ORAL_TABLET | Freq: Four times a day (QID) | ORAL | Status: DC | PRN

## 2021-11-05 MED ORDER — METOCLOPRAMIDE HCL 5 MG PO TABS: 5.0000 mg | ORAL_TABLET | Freq: Three times a day (TID) | ORAL | Status: DC | PRN

## 2021-11-05 MED ORDER — ONDANSETRON HCL 4 MG/2ML IJ SOLN
INTRAMUSCULAR | Status: AC
  Filled 2021-11-05: qty 2

## 2021-11-05 MED ORDER — HYDROCHLOROTHIAZIDE 12.5 MG PO TABS: 12.5000 mg | ORAL_TABLET | Freq: Every day | ORAL | Status: DC

## 2021-11-05 MED ORDER — OXYCODONE HCL 5 MG PO TABS: 10.0000 mg | ORAL_TABLET | ORAL | Status: DC | PRN

## 2021-11-05 MED ORDER — LOSARTAN POTASSIUM-HCTZ 100-12.5 MG PO TABS: 1.0000 | ORAL_TABLET | Freq: Every day | ORAL | Status: DC

## 2021-11-05 MED ORDER — APIXABAN 5 MG PO TABS
5.0000 mg | ORAL_TABLET | Freq: Two times a day (BID) | ORAL | Status: DC
  Administered 2021-11-06: 5 mg via ORAL
  Filled 2021-11-05: qty 1

## 2021-11-05 MED ORDER — MIDAZOLAM HCL 5 MG/5ML IJ SOLN
INTRAMUSCULAR | Status: DC | PRN
  Administered 2021-11-05: 1 mg via INTRAVENOUS

## 2021-11-05 MED ORDER — ALUM & MAG HYDROXIDE-SIMETH 200-200-20 MG/5ML PO SUSP: 30.0000 mL | ORAL | Status: DC | PRN

## 2021-11-05 MED ORDER — PANTOPRAZOLE SODIUM 40 MG PO TBEC
40.0000 mg | DELAYED_RELEASE_TABLET | Freq: Every day | ORAL | Status: DC
  Administered 2021-11-05 – 2021-11-06 (×2): 40 mg via ORAL
  Filled 2021-11-05 (×2): qty 1

## 2021-11-05 MED ORDER — METHOCARBAMOL 500 MG IVPB - SIMPLE MED
500.0000 mg | Freq: Four times a day (QID) | INTRAVENOUS | Status: DC | PRN
  Administered 2021-11-05: 500 mg via INTRAVENOUS

## 2021-11-05 SURGICAL SUPPLY — 43 items
ARTICULEZE HEAD (Hips) ×1 IMPLANT
BAG COUNTER SPONGE SURGICOUNT (BAG) ×1 IMPLANT
BAG SPNG CNTER NS LX DISP (BAG) ×1
BAG ZIPLOCK 12X15 (MISCELLANEOUS) IMPLANT
BENZOIN TINCTURE PRP APPL 2/3 (GAUZE/BANDAGES/DRESSINGS) IMPLANT
BLADE SAW SGTL 18X1.27X75 (BLADE) ×1 IMPLANT
COVER PERINEAL POST (MISCELLANEOUS) ×1 IMPLANT
COVER SURGICAL LIGHT HANDLE (MISCELLANEOUS) ×1 IMPLANT
CUP ACET PINNACLE SECTR 60MM (Hips) IMPLANT
DRAPE FOOT SWITCH (DRAPES) ×1 IMPLANT
DRAPE STERI IOBAN 125X83 (DRAPES) ×1 IMPLANT
DRAPE U-SHAPE 47X51 STRL (DRAPES) ×2 IMPLANT
DRSG AQUACEL AG ADV 3.5X10 (GAUZE/BANDAGES/DRESSINGS) ×1 IMPLANT
DURAPREP 26ML APPLICATOR (WOUND CARE) ×1 IMPLANT
ELECT REM PT RETURN 15FT ADLT (MISCELLANEOUS) ×1 IMPLANT
GAUZE XEROFORM 1X8 LF (GAUZE/BANDAGES/DRESSINGS) ×1 IMPLANT
GLOVE BIO SURGEON STRL SZ7.5 (GLOVE) ×1 IMPLANT
GLOVE BIOGEL PI IND STRL 8 (GLOVE) ×2 IMPLANT
GLOVE BIOGEL PI INDICATOR 8 (GLOVE) ×2
GLOVE ECLIPSE 8.0 STRL XLNG CF (GLOVE) ×1 IMPLANT
GOWN STRL REUS W/ TWL XL LVL3 (GOWN DISPOSABLE) ×2 IMPLANT
GOWN STRL REUS W/TWL XL LVL3 (GOWN DISPOSABLE) ×2
HANDPIECE INTERPULSE COAX TIP (DISPOSABLE) ×1
HEAD ARTICULEZE (Hips) IMPLANT
HOLDER FOLEY CATH W/STRAP (MISCELLANEOUS) ×1 IMPLANT
KIT TURNOVER KIT A (KITS) IMPLANT
LINER PINN ALTRX ACTABR 36X60 (Liner) IMPLANT
LINER PINNACLE ALTRAX ACTABULR (Liner) ×1 IMPLANT
PACK ANTERIOR HIP CUSTOM (KITS) ×1 IMPLANT
PINNSECTOR W/GRIP ACE CUP 60MM (Hips) ×1 IMPLANT
SET HNDPC FAN SPRY TIP SCT (DISPOSABLE) ×1 IMPLANT
STAPLER VISISTAT 35W (STAPLE) IMPLANT
STEM AMT HIGH CORAIL SZ13X135 (Stem) IMPLANT
STRIP CLOSURE SKIN 1/2X4 (GAUZE/BANDAGES/DRESSINGS) IMPLANT
SUT ETHIBOND NAB CT1 #1 30IN (SUTURE) ×1 IMPLANT
SUT ETHILON 2 0 PS N (SUTURE) IMPLANT
SUT MNCRL AB 4-0 PS2 18 (SUTURE) IMPLANT
SUT VIC AB 0 CT1 36 (SUTURE) ×1 IMPLANT
SUT VIC AB 1 CT1 36 (SUTURE) ×1 IMPLANT
SUT VIC AB 2-0 CT1 27 (SUTURE) ×2
SUT VIC AB 2-0 CT1 TAPERPNT 27 (SUTURE) ×2 IMPLANT
TRAY FOLEY MTR SLVR 16FR STAT (SET/KITS/TRAYS/PACK) IMPLANT
YANKAUER SUCT BULB TIP NO VENT (SUCTIONS) ×1 IMPLANT

## 2021-11-05 NOTE — Plan of Care (Signed)

## 2021-11-05 NOTE — Evaluation (Signed)
Physical Therapy Evaluation Patient Details Name: Jeffery White MRN: [id number] DOB: 03-Oct-1933 Today's Date: 11/05/2021  History of Present Illness  Pt is a 88yo male presenting s/p L-THA, AA on 11/05/21. PMH: atrial flutter, gout, HTN  Clinical Impression  Jeffery White is a 88 y.o. male POD 0 s/p L-THA, AA. Patient reports modified independence using SPC for mobility at baseline. Patient is now limited by functional impairments (see PT problem list below) and requires supervision for bed mobility and min gaurd for transfers. Patient was able to ambulate 90 feet with RW and min guard level of assist. Patient instructed in exercise to facilitate ROM and circulation to manage edema. Provided incentive spirometer and with Vcs pt able to achieve . Patient will benefit from continued skilled PT interventions to address impairments and progress towards PLOF. Acute PT will follow to progress mobility and stair training in preparation for safe discharge home.       Recommendations for follow up therapy are one component of a multi-disciplinary discharge planning process, led by the attending physician.  Recommendations may be updated based on patient status, additional functional criteria and insurance authorization.  Follow Up Recommendations Follow physician's recommendations for discharge plan and follow up therapies      Assistance Recommended at Discharge Set up Supervision/Assistance  Patient can return home with the following  A little help with walking and/or transfers;A little help with bathing/dressing/bathroom;Assistance with cooking/housework;Assist for transportation;Help with stairs or ramp for entrance    Equipment Recommendations Rolling walker (2 wheels)  Recommendations for Other Services       Functional Status Assessment Patient has had a recent decline in their functional status and demonstrates the ability to make significant improvements in function in a reasonable  and predictable amount of time.     Precautions / Restrictions Precautions Precautions: Fall Restrictions Weight Bearing Restrictions: Yes LLE Weight Bearing: Weight bearing as tolerated      Mobility  Bed Mobility Overal bed mobility: Needs Assistance Bed Mobility: Supine to Sit     Supine to sit: Supervision     General bed mobility comments: For safety only, no physical assist required    Transfers Overall transfer level: Needs assistance Equipment used: Rolling walker (2 wheels) Transfers: Sit to/from Stand Sit to Stand: Min guard, From elevated surface           General transfer comment: For safety from elevated surface, no physical assist required    Ambulation/Gait Ambulation/Gait assistance: Min guard Gait Distance (Feet): 90 Feet Assistive device: Rolling walker (2 wheels) Gait Pattern/deviations: Step-through pattern Gait velocity: decreased     General Gait Details: Pt ambulated with RW and min guard no physical assist required or overt LOB noted, VCs for proximity to device.  Stairs            Wheelchair Mobility    Modified Rankin (Stroke Patients Only)       Balance Overall balance assessment: Needs assistance Sitting-balance support: Feet supported, No upper extremity supported Sitting balance-Leahy Scale: Good     Standing balance support: Reliant on assistive device for balance, During functional activity, Bilateral upper extremity supported Standing balance-Leahy Scale: Poor                               Pertinent Vitals/Pain Pain Assessment Pain Assessment: 0-10 Pain Score: 5  Pain Location: LEFT HIP Pain Descriptors / Indicators: Operative site guarding Pain Intervention(s): Monitored during session, Limited  activity within patient's tolerance, Repositioned, Ice applied    Home Living Family/patient expects to be discharged to:: Private residence Living Arrangements: Spouse/significant other Available  Help at Discharge: Family;Available 24 hours/day Type of Home: House Home Access: Stairs to enter Entrance Stairs-Rails: None Entrance Stairs-Number of Steps: 2   Home Layout: One level Home Equipment: Cane - single point      Prior Function Prior Level of Function : Independent/Modified Independent             Mobility Comments: SPC ADLs Comments: ind     Hand Dominance        Extremity/Trunk Assessment   Upper Extremity Assessment Upper Extremity Assessment: Overall WFL for tasks assessed    Lower Extremity Assessment Lower Extremity Assessment: RLE deficits/detail;LLE deficits/detail RLE Deficits / Details: MMT ank DF/PF 5/5 RLE Sensation: WNL LLE Deficits / Details: MMT ank DF/PF 5/5 LLE Sensation: WNL    Cervical / Trunk Assessment Cervical / Trunk Assessment: Normal  Communication   Communication: No difficulties  Cognition Arousal/Alertness: Awake/alert Behavior During Therapy: WFL for tasks assessed/performed Overall Cognitive Status: Within Functional Limits for tasks assessed                                          General Comments      Exercises Total Joint Exercises Ankle Circles/Pumps: AROM, Both, 10 reps   Assessment/Plan    PT Assessment Patient needs continued PT services  PT Problem List Decreased strength;Decreased range of motion;Decreased activity tolerance;Decreased balance;Decreased mobility;Decreased coordination;Pain       PT Treatment Interventions DME instruction;Gait training;Stair training;Functional mobility training;Therapeutic activities;Therapeutic exercise;Balance training;Neuromuscular re-education;Patient/family education    PT Goals (Current goals can be found in the Care Plan section)  Acute Rehab PT Goals Patient Stated Goal: Fishing and hunting PT Goal Formulation: With patient Time For Goal Achievement: 11/12/21 Potential to Achieve Goals: Good    Frequency 7X/week     Co-evaluation                AM-PAC PT "6 Clicks" Mobility  Outcome Measure Help needed turning from your back to your side while in a flat bed without using bedrails?: None Help needed moving from lying on your back to sitting on the side of a flat bed without using bedrails?: None Help needed moving to and from a bed to a chair (including a wheelchair)?: A Little Help needed standing up from a chair using your arms (e.g., wheelchair or bedside chair)?: A Little Help needed to walk in hospital room?: A Little Help needed climbing 3-5 steps with a railing? : A Little 6 Click Score: 20    End of Session Equipment Utilized During Treatment: Gait belt Activity Tolerance: Patient tolerated treatment well;No increased pain Patient left: in chair;with call bell/phone within reach;with chair alarm set;with SCD's reapplied Nurse Communication: Mobility status PT Visit Diagnosis: Pain;Difficulty in walking, not elsewhere classified (R26.2) Pain - Right/Left: Left Pain - part of body: Hip    Time: 9373-4287 PT Time Calculation (min) (ACUTE ONLY): 22 min   Charges:   PT Evaluation $PT Eval Low Complexity: 1 Low          Jamesetta Geralds, PT, DPT [facility] Department Office: [phone] Pager: [phone]  Jamesetta Geralds 11/05/2021, 5:00 PM

## 2021-11-05 NOTE — Brief Op Note (Signed)
11/05/2021  11:11 AM  PATIENT:  Jeffery White  88 y.o. male  PRE-OPERATIVE DIAGNOSIS:  OSTEOARTHRITIS / DEGENERATIVE JOINT DISEASE LEFT HIP  POST-OPERATIVE DIAGNOSIS:  OSTEOARTHRITIS / DEGENERATIVE JOINT DISEASE LEFT HIP  PROCEDURE:  Procedure(s): LEFT TOTAL HIP ARTHROPLASTY ANTERIOR APPROACH (Left)  SURGEON:  Surgeon(s) and Role:    * Kathryne Hitch, MD - Primary  PHYSICIAN ASSISTANT:  [name], PA-C  ANESTHESIA:   spinal  EBL:  250 mL   COUNTS:  YES  DICTATION: .Other Dictation: Dictation Number [id number]  PLAN OF CARE: Admit for overnight observation  PATIENT DISPOSITION:  PACU - hemodynamically stable.   Delay start of Pharmacological VTE agent (>24hrs) due to surgical blood loss or risk of bleeding: no

## 2021-11-05 NOTE — Plan of Care (Signed)
  Problem: Education: Goal: Knowledge of the prescribed therapeutic regimen will improve Outcome: Progressing   Problem: Pain Management: Goal: Pain level will decrease with appropriate interventions Outcome: Progressing   Problem: Safety: Goal: Ability to remain free from injury will improve Outcome: Progressing   Problem: Skin Integrity: Goal: Risk for impaired skin integrity will decrease Outcome: Progressing   

## 2021-11-05 NOTE — Op Note (Signed)
NAME: Jeffery White, Jeffery White MEDICAL RECORD NO: [id number] ACCOUNT NO: [id number] DATE OF BIRTH: [birth year] FACILITY: [facility] LOCATION: [facility] PHYSICIAN: Vanita Panda. Magnus Ivan, MD  Operative Report   DATE OF PROCEDURE: 11/05/2021  PREOPERATIVE DIAGNOSIS:  Primary osteoarthritis and degenerative joint disease, left hip.  POSTOPERATIVE DIAGNOSIS:  Primary osteoarthritis and degenerative joint disease, left hip.  PROCEDURE:  Left total hip arthroplasty through direct anterior approach.  IMPLANTS:  DePuy Sector Gription acetabular component size 60, size 36+0 neutral polyethylene liner, size 13 Corail femoral component with high offset, size 36+5 metal hip ball.  SURGEON:  Vanita Panda. Magnus Ivan MD  ASSISTANT:  Richardean Canal, PA-C  ANESTHESIA:  Spinal.  ANTIBIOTICS:  2 g IV Ancef.  BLOOD LOSS:  250 mL.  COMPLICATIONS:  None.  INDICATIONS:  The patient is a very pleasant 88 year old gentleman well known to me.  I have actually replaced his right hip before.  His left hip has developed severe arthritis in it.  At this point, it is detrimentally affecting his mobility, his  quality of life and his activities of daily living and he has failed conservative treatment for over a year now.  At this point, he does wish to proceed with a total hip arthroplasty on the left side.  Having this done before on the right side, he is  fully aware of the risk of acute blood loss anemia, nerve or vessel injury, fracture, infection, dislocation, DVT, implant failure, leg length differences and skin and soft tissue issues.  He understands our goals are hopefully to decrease pain, improve  mobility and overall improve quality of life.  DESCRIPTION OF PROCEDURE:  After informed consent was obtained, appropriate left hip was marked. He was brought to the operating room and sat up on a stretcher where spinal anesthesia was obtained.  He was then laid in supine position on the stretcher.   Foley catheter was placed  and traction boots were placed on both his feet.  Next, he was placed supine on the Hana fracture table with a perineal post in place and both legs in line skeletal traction devices with no traction applied.  His left operative  hip was prepped and draped with DuraPrep and sterile drapes.  A timeout was called and he was identified as correct patient, correct left hip.  We then made an incision just inferior and posterior to the anterior superior iliac spine and carried this  obliquely down the leg.  We dissected down tensor fascia lata muscle.  Tensor fascia was then divided longitudinally to proceed with direct anterior approach to the hip.  We identified and cauterized circumflex vessels, then identified the hip capsule,  opened the hip capsule in L-type format, finding a moderate joint effusion.  We found periarticular osteophytes all around the lateral femoral head and neck.  We made our femoral neck cut with oscillating saw just proximal to the lesser trochanter and  completed this with an osteotome.  I placed a corkscrew guide in the femoral head and removed the femoral head in its entirety and again found a wide area devoid of cartilage.  I then placed a bent Hohmann over the medial acetabular rim and removed  remnants of acetabular labrum and other debris.  We then began reaming in a stepwise increments from a small reamer going all the way up to size 59 reamer with all reamers placed under direct visualization, the last reamer was placed under direct  fluoroscopy, so we could obtain our depth of reaming, our inclination and  anteversion.  I then placed real DePuy Sector Gription acetabular component size 60 and a 36+0 neutral polyethylene liner for that size acetabular component.  Attention was then  turned to the femur.  With the leg externally rotated to 120 degrees, extended and adducted, we were able to place a Mueller retractor medially and Hohmann retractor behind the greater trochanter.  We  released lateral joint capsule and used a box cutting  osteotome to enter femoral canal and a rongeur to lateralize.  We then began broaching using the Corail broaching system from a size 0 going to a size 13.  With size 13 in place, we trialed a standard offset femoral neck and 36+1.5 trial hip ball,  reduced this in acetabulum and based on radiographic assessment, we felt like we needed more leg length than offset.  We dislocated the hip, removed the trial components.  We then placed the real femoral component size 13 but one with high offset, which  was Corail high offset stem and a 36+5 metal hip ball and again reduced this in the acetabulum.  We were pleased with leg length, offset, range of motion and stability assessed mechanically and radiographically.  We then irrigated the soft tissue with  normal saline solution.  We closed the joint capsule with interrupted #1 Ethibond suture followed by #1 Vicryl to close the tensor fascia.  0 Vicryl was used to close deep tissue and 2-0 Vicryl was used to close subcutaneous tissue.  The skin was closed  with staples.  An Aquacel dressing was applied.  He was taken off the Hana table and taken to recovery room in stable condition with all final counts being correct and no complications noted.  Of note, [name], PA-C, assisted during the entire case  from beginning to end and his assistance was medically necessary and crucial for soft tissue management and retraction including helping guide implant placement and layered closure of the wound.   NIK D: 11/05/2021 11:09:10 am T: 11/05/2021 12:44:00 pm  JOB: [id number]/ [id number]

## 2021-11-05 NOTE — Interval H&P Note (Signed)
History and Physical Interval Note:  The patient understands that he is here today for a left total hip replacement to treat his left hip osteoarthritis.  There has been no acute or interval change in medical status.  See recent H&P.  The risks and benefits of surgery been explained in detail and informed that is obtained.  The left operative hip has been marked.  11/05/2021 8:40 AM  Jeffery White  has presented today for surgery, with the diagnosis of OSTEOARTHRITIS / DEGENERATIVE JOINT DISEASE LEFT HIP.  The various methods of treatment have been discussed with the patient and family. After consideration of risks, benefits and other options for treatment, the patient has consented to  Procedure(s): LEFT TOTAL HIP ARTHROPLASTY ANTERIOR APPROACH (Left) as a surgical intervention.  The patient's history has been reviewed, patient examined, no change in status, stable for surgery.  I have reviewed the patient's chart and labs.  Questions were answered to the patient's satisfaction.     Kathryne Hitch

## 2021-11-05 NOTE — Anesthesia Procedure Notes (Addendum)
Spinal  Patient location during procedure: OR Start time: 11/05/2021 9:55 AM End time: 11/05/2021 9:59 AM Reason for block: surgical anesthesia Staffing Performed: resident/CRNA  Resident/CRNA: Sharlette Dense, CRNA Performed by: Sharlette Dense, CRNA Authorized by: Murvin Natal, MD   Preanesthetic Checklist Completed: patient identified, IV checked, site marked, risks and benefits discussed, surgical consent, monitors and equipment checked, pre-op evaluation and timeout performed Spinal Block Patient position: sitting Prep: DuraPrep and site prepped and draped Patient monitoring: heart rate, continuous pulse ox and blood pressure Approach: midline Location: L3-4 Injection technique: single-shot Needle Needle type: Sprotte and Pencan  Needle gauge: 24 G Needle length: 10 cm Assessment Sensory level: T4 Additional Notes Kit expiration date 09/18/2023 and lot #[id number] Clear free flow CSF, negative heme, negative paresthesia Tolerated well and returned to supine position

## 2021-11-05 NOTE — Anesthesia Postprocedure Evaluation (Signed)
Anesthesia Post Note  Patient: Jeffery White  Procedure(s) Performed: LEFT TOTAL HIP ARTHROPLASTY ANTERIOR APPROACH (Left: Hip)     Patient location during evaluation: PACU Anesthesia Type: Spinal Level of consciousness: awake Pain management: pain level controlled Vital Signs Assessment: post-procedure vital signs reviewed and stable Respiratory status: spontaneous breathing, nonlabored ventilation, respiratory function stable and patient connected to nasal cannula oxygen Cardiovascular status: stable and blood pressure returned to baseline Postop Assessment: no apparent nausea or vomiting Anesthetic complications: no   No notable events documented.  Last Vitals:  Vitals:   11/05/21 1315 11/05/21 1330  BP: 102/72 100/74  Pulse: (!) 49 (!) 51  Resp: 13 16  Temp: (!) 36.1 C   SpO2: 96% 97%    Last Pain:  Vitals:   11/05/21 1315  TempSrc:   PainSc: 4                  Leoda Smithhart P Jazilyn Siegenthaler

## 2021-11-05 NOTE — Transfer of Care (Signed)
Immediate Anesthesia Transfer of Care Note  Patient: Jeffery White  Procedure(s) Performed: LEFT TOTAL HIP ARTHROPLASTY ANTERIOR APPROACH (Left: Hip)  Patient Location: PACU  Anesthesia Type:Spinal  Level of Consciousness: awake, alert  and oriented  Airway & Oxygen Therapy: Patient Spontanous Breathing and Patient connected to face mask oxygen  Post-op Assessment: Report given to RN and Post -op Vital signs reviewed and stable  Post vital signs: Reviewed and stable  Last Vitals:  Vitals Value Taken Time  BP 98/80   Temp    Pulse 71 11/05/21 1131  Resp 20 11/05/21 1131  SpO2 100 % 11/05/21 1131  Vitals shown include unvalidated device data.  Last Pain:  Vitals:   11/05/21 0757  TempSrc:   PainSc: 2       Patients Stated Pain Goal: 3 (11/05/21 0757)  Complications: No notable events documented.

## 2021-11-05 NOTE — Anesthesia Procedure Notes (Signed)
Date/Time: 11/05/2021 9:54 AM  Performed by: Florene Route, CRNAOxygen Delivery Method: Simple face mask

## 2021-11-06 DIAGNOSIS — M1612 Unilateral primary osteoarthritis, left hip: Secondary | ICD-10-CM | POA: Diagnosis not present

## 2021-11-06 LAB — CBC
HCT: 32.2 % — ABNORMAL LOW (ref 39.0–52.0)
Hemoglobin: 10.5 g/dL — ABNORMAL LOW (ref 13.0–17.0)
MCH: 27.6 pg (ref 26.0–34.0)
MCHC: 32.6 g/dL (ref 30.0–36.0)
MCV: 84.7 fL (ref 80.0–100.0)
Platelets: 166 10*3/uL (ref 150–400)
RBC: 3.8 MIL/uL — ABNORMAL LOW (ref 4.22–5.81)
RDW: 16.2 % — ABNORMAL HIGH (ref 11.5–15.5)
WBC: 11.1 10*3/uL — ABNORMAL HIGH (ref 4.0–10.5)
nRBC: 0 % (ref 0.0–0.2)

## 2021-11-06 LAB — BASIC METABOLIC PANEL
Anion gap: 5 (ref 5–15)
BUN: 21 mg/dL (ref 8–23)
CO2: 27 mmol/L (ref 22–32)
Calcium: 8.7 mg/dL — ABNORMAL LOW (ref 8.9–10.3)
Chloride: 105 mmol/L (ref 98–111)
Creatinine, Ser: 0.93 mg/dL (ref 0.61–1.24)
GFR, Estimated: >60 mL/min (ref >60)
Glucose, Bld: 137 mg/dL — ABNORMAL HIGH (ref 70–99)
Potassium: 3.9 mmol/L (ref 3.5–5.1)
Sodium: 137 mmol/L (ref 135–145)

## 2021-11-06 MED ORDER — OXYCODONE HCL 5 MG PO TABS: 5.0000 mg | ORAL_TABLET | ORAL | 0 refills | Status: DC | PRN

## 2021-11-06 NOTE — Discharge Instructions (Signed)

## 2021-11-06 NOTE — Progress Notes (Signed)
Subjective: 1 Day Post-Op Procedure(s) (LRB): LEFT TOTAL HIP ARTHROPLASTY ANTERIOR APPROACH (Left) Patient reports pain as moderate.    Objective: Vital signs in last 24 hours: Temp:  [97 F (36.1 C)-98.5 F (36.9 C)] 97.5 F (36.4 C) (08/19 0518) Pulse Rate:  [49-70] 53 (08/19 0518) Resp:  [13-18] 18 (08/19 0518) BP: (91-125)/(61-79) 111/62 (08/19 0518) SpO2:  [94 %-100 %] 97 % (08/19 0518)  Intake/Output from previous day: 08/18 0701 - 08/19 0700 In: 3077.9 [P.O.:600; I.V.:2117.5; IV Piggyback:360.4] Out: 1600 [Urine:1350; Blood:250] Intake/Output this shift: No intake/output data recorded.  Recent Labs    11/06/21 0411  HGB 10.5*   Recent Labs    11/06/21 0411  WBC 11.1*  RBC 3.80*  HCT 32.2*  PLT 166   Recent Labs    11/06/21 0411  NA 137  K 3.9  CL 105  CO2 27  BUN 21  CREATININE 0.93  GLUCOSE 137*  CALCIUM 8.7*   No results for input(s): "LABPT", "INR" in the last 72 hours.  Sensation intact distally Intact pulses distally Dorsiflexion/Plantar flexion intact Incision: dressing C/D/I Compartment soft   Assessment/Plan: 1 Day Post-Op Procedure(s) (LRB): LEFT TOTAL HIP ARTHROPLASTY ANTERIOR APPROACH (Left) Up with therapy Discharge home with home health      Kathryne Hitch 11/06/2021, 9:45 AM

## 2021-11-06 NOTE — Plan of Care (Signed)
  Problem: Education: Goal: Knowledge of the prescribed therapeutic regimen will improve 11/06/2021 1455 by Marni Griffon I, RN Outcome: Adequate for Discharge 11/06/2021 1053 by Marni Griffon I, RN Outcome: Progressing Goal: Understanding of discharge needs will improve Outcome: Adequate for Discharge Goal: Individualized Educational Video(s) Outcome: Adequate for Discharge   Problem: Activity: Goal: Ability to avoid complications of mobility impairment will improve 11/06/2021 1455 by Marni Griffon I, RN Outcome: Adequate for Discharge 11/06/2021 1053 by Marni Griffon I, RN Outcome: Progressing Goal: Ability to tolerate increased activity will improve Outcome: Adequate for Discharge   Problem: Clinical Measurements: Goal: Postoperative complications will be avoided or minimized Outcome: Adequate for Discharge   Problem: Pain Management: Goal: Pain level will decrease with appropriate interventions 11/06/2021 1455 by Marni Griffon I, RN Outcome: Adequate for Discharge 11/06/2021 1053 by Marni Griffon I, RN Outcome: Progressing   Problem: Education: Goal: Knowledge of General Education information will improve Description: Including pain rating scale, medication(s)/side effects and non-pharmacologic comfort measures 11/06/2021 1455 by Marni Griffon I, RN Outcome: Adequate for Discharge 11/06/2021 1053 by Marni Griffon I, RN Outcome: Progressing   Problem: Health Behavior/Discharge Planning: Goal: Ability to manage health-related needs will improve Outcome: Adequate for Discharge   Problem: Clinical Measurements: Goal: Ability to maintain clinical measurements within normal limits will improve Outcome: Adequate for Discharge Goal: Will remain free from infection Outcome: Adequate for Discharge Goal: Diagnostic test results will improve Outcome: Adequate for Discharge Goal: Respiratory complications will improve Outcome: Adequate for Discharge Goal:  Cardiovascular complication will be avoided Outcome: Adequate for Discharge   Problem: Activity: Goal: Risk for activity intolerance will decrease 11/06/2021 1455 by Marni Griffon I, RN Outcome: Adequate for Discharge 11/06/2021 1053 by Marni Griffon I, RN Outcome: Progressing   Problem: Nutrition: Goal: Adequate nutrition will be maintained Outcome: Adequate for Discharge   Problem: Coping: Goal: Level of anxiety will decrease Outcome: Adequate for Discharge   Problem: Elimination: Goal: Will not experience complications related to bowel motility Outcome: Adequate for Discharge Goal: Will not experience complications related to urinary retention Outcome: Adequate for Discharge   Problem: Pain Managment: Goal: General experience of comfort will improve 11/06/2021 1455 by Marni Griffon I, RN Outcome: Adequate for Discharge 11/06/2021 1053 by Marni Griffon I, RN Outcome: Progressing   Problem: Safety: Goal: Ability to remain free from injury will improve 11/06/2021 1455 by Marni Griffon I, RN Outcome: Adequate for Discharge 11/06/2021 1053 by Marni Griffon I, RN Outcome: Progressing

## 2021-11-06 NOTE — Plan of Care (Signed)
  Problem: Education: Goal: Knowledge of the prescribed therapeutic regimen will improve Outcome: Progressing   Problem: Activity: Goal: Ability to avoid complications of mobility impairment will improve Outcome: Progressing   Problem: Pain Management: Goal: Pain level will decrease with appropriate interventions Outcome: Progressing   Problem: Education: Goal: Knowledge of General Education information will improve Description: Including pain rating scale, medication(s)/side effects and non-pharmacologic comfort measures Outcome: Progressing   Problem: Activity: Goal: Risk for activity intolerance will decrease Outcome: Progressing   Problem: Pain Managment: Goal: General experience of comfort will improve Outcome: Progressing   Problem: Safety: Goal: Ability to remain free from injury will improve Outcome: Progressing

## 2021-11-06 NOTE — TOC Transition Note (Signed)
Transition of Care [facility]) - CM/SW Discharge Note   Patient Details  Name: Jeffery White MRN: [id number] Date of Birth: 12/03/1933  Transition of Care [facility]) CM/SW Contact:  Darleene Cleaver, LCSW Phone Number: 11/06/2021, 11:47 AM   Clinical Narrative:    Patient will be going home with home health through [facility].  [facility] was prearranged at MD office.   CSW signing off please reconsult with any other social work needs, home health agency has been notified of planned discharge.  Rolling walker has been ordered per family request through [employer].  CSW spoke to patient's family member [name], and she will contact someone to pick him up from the hospital once equipment has been delivered.      Final next level of care: Home w Home Health Services Barriers to Discharge: Barriers Resolved   Patient Goals and CMS Choice Patient states their goals for this hospitalization and ongoing recovery are:: To return back home CMS Medicare.gov Compare Post Acute Care list provided to:: Patient Represenative (must comment) Choice offered to / list presented to : Adult Children  Discharge Placement                  Name of family member notified: Patient' family member [name] Patient and family notified of of transfer: 11/06/21  Discharge Plan and Services                DME Arranged: Dan Humphreys rolling DME Agency: [employer] Date DME Agency Contacted: 11/06/21 Time DME Agency Contacted: [phone] Representative spoke with at DME Agency: Leavy Cella HH Arranged: PT HH Agency: [facility]        Social Determinants of Health (SDOH) Interventions     Readmission Risk Interventions     No data to display

## 2021-11-06 NOTE — Progress Notes (Signed)
Physical Therapy Treatment Patient Details Name: Jeffery White MRN: [id number] DOB: 1933-06-02 Today's Date: 11/06/2021   History of Present Illness Pt is a 88yo male presenting s/p L-THA, AA on 11/05/21. PMH: atrial flutter, gout, HTN    PT Comments    Pt seen POD1 in good spirits and ready to mobilize. Demonstrated modified independence for bed mobility and transfers, supervision for ambulation in hallway 276f with RW, min assist for stair training. Provided HEP and pt completed supine portion with multimodal cuing, demonstrating safe technique. All education completed and pt has no further questions. Pt has met mobility goals for safe discharge home with family assistance. PT is signing off, should needs change, please re-consult.   Recommendations for follow up therapy are one component of a multi-disciplinary discharge planning process, led by the attending physician.  Recommendations may be updated based on patient status, additional functional criteria and insurance authorization.  Follow Up Recommendations  Follow physician's recommendations for discharge plan and follow up therapies     Assistance Recommended at Discharge Set up Supervision/Assistance  Patient can return home with the following A little help with walking and/or transfers;A little help with bathing/dressing/bathroom;Assistance with cooking/housework;Assist for transportation;Help with stairs or ramp for entrance   Equipment Recommendations  Rolling walker (2 wheels)    Recommendations for Other Services       Precautions / Restrictions Precautions Precautions: Fall Restrictions Weight Bearing Restrictions: No LLE Weight Bearing: Weight bearing as tolerated     Mobility  Bed Mobility Overal bed mobility: Needs Assistance Bed Mobility: Supine to Sit     Supine to sit: Modified independent (Device/Increase time)     General bed mobility comments: Increased time    Transfers Overall transfer  level: Modified independent Equipment used: Rolling walker (2 wheels) Transfers: Sit to/from Stand Sit to Stand: Modified independent (Device/Increase time)           General transfer comment: Use of RW and increasd time    Ambulation/Gait Ambulation/Gait assistance: Supervision Gait Distance (Feet): 200 Feet Assistive device: Rolling walker (2 wheels) Gait Pattern/deviations: Step-through pattern Gait velocity: decreased     General Gait Details: Pt ambulated with RW and min guard no physical assist required or overt LOB noted, VCs for proximity to device.   Stairs Stairs: Yes Stairs assistance: Min assist Stair Management: No rails, Step to pattern, Backwards, With walker Number of Stairs: 2 General stair comments: Pt educated on backwards stair mobility with RW and min assist, handout provided, verbalized understanding. Pt demonstrated safe technique with min assist, no overt LOB, VCs for sequencing.   Wheelchair Mobility    Modified Rankin (Stroke Patients Only)       Balance Overall balance assessment: Needs assistance Sitting-balance support: Feet supported, No upper extremity supported Sitting balance-Leahy Scale: Good     Standing balance support: Reliant on assistive device for balance, During functional activity, Bilateral upper extremity supported Standing balance-Leahy Scale: Poor                              Cognition Arousal/Alertness: Awake/alert Behavior During Therapy: WFL for tasks assessed/performed Overall Cognitive Status: Within Functional Limits for tasks assessed                                          Exercises Total Joint Exercises Ankle Circles/Pumps: AROM, Both, 10  reps Quad Sets: AROM, Left, 10 reps Short Arc Quad: AROM, Left, 10 reps Heel Slides: AROM, Left, 10 reps Hip ABduction/ADduction: AROM, Left, 10 reps    General Comments        Pertinent Vitals/Pain Pain Assessment Pain  Assessment: 0-10 Pain Score: 4  Pain Location: LEFT HIP Pain Descriptors / Indicators: Operative site guarding Pain Intervention(s): Limited activity within patient's tolerance, Monitored during session, Repositioned, Ice applied    Home Living                          Prior Function            PT Goals (current goals can now be found in the care plan section) Acute Rehab PT Goals Patient Stated Goal: Fishing and hunting PT Goal Formulation: With patient Time For Goal Achievement: 11/12/21 Potential to Achieve Goals: Good Progress towards PT goals: Goals met/education completed, patient discharged from PT    Frequency    7X/week      PT Plan Current plan remains appropriate    Co-evaluation              AM-PAC PT "6 Clicks" Mobility   Outcome Measure  Help needed turning from your back to your side while in a flat bed without using bedrails?: None Help needed moving from lying on your back to sitting on the side of a flat bed without using bedrails?: None Help needed moving to and from a bed to a chair (including a wheelchair)?: A Little Help needed standing up from a chair using your arms (e.g., wheelchair or bedside chair)?: A Little Help needed to walk in hospital room?: A Little Help needed climbing 3-5 steps with a railing? : A Little 6 Click Score: 20    End of Session Equipment Utilized During Treatment: Gait belt Activity Tolerance: Patient tolerated treatment well;No increased pain Patient left: in chair;with call bell/phone within reach;with chair alarm set Nurse Communication: Mobility status PT Visit Diagnosis: Pain;Difficulty in walking, not elsewhere classified (R26.2) Pain - Right/Left: Left Pain - part of body: Hip     Time: 8453-6468 PT Time Calculation (min) (ACUTE ONLY): 25 min  Charges:  $Gait Training: 8-22 mins $Therapeutic Exercise: 8-22 mins                     Coolidge Breeze, PT, DPT [facility]  Department Office: [phone] Pager: [phone]   Coolidge Breeze 11/06/2021, 12:32 PM

## 2021-11-07 NOTE — Discharge Summary (Signed)
Patient ID: Jeffery White MRN: [id number] DOB/AGE: Oct 10, 1933 88 y.o.  Admit date: 11/05/2021 Discharge date: 11/06/21  Admission Diagnoses:  Principal Problem:   Unilateral primary osteoarthritis, left hip Active Problems:   Status post total replacement of left hip   Discharge Diagnoses:  Same  Past Medical History:  Diagnosis Date   Arthritis    Atrial flutter (HCC)    Chronic back pain    Gout    Heart murmur    Hypercholesteremia    Hypertension     Surgeries: Procedure(s): LEFT TOTAL HIP ARTHROPLASTY ANTERIOR APPROACH on 11/05/2021   Consultants:   Discharged Condition: Improved  Hospital Course: Jeffery White is an 88 y.o. male who was admitted 11/05/2021 for operative treatment ofUnilateral primary osteoarthritis, left hip. Patient has severe unremitting pain that affects sleep, daily activities, and work/hobbies. After pre-op clearance the patient was taken to the operating room on 11/05/2021 and underwent  Procedure(s): LEFT TOTAL HIP ARTHROPLASTY ANTERIOR APPROACH.    Patient was given perioperative antibiotics:  Anti-infectives (From admission, onward)    Start     Dose/Rate Route Frequency Ordered Stop   11/05/21 1600  ceFAZolin (ANCEF) IVPB 1 g/50 mL premix        1 g 100 mL/hr over 30 Minutes Intravenous Every 6 hours 11/05/21 1344 11/05/21 2207   11/05/21 0745  ceFAZolin (ANCEF) IVPB 2g/100 mL premix        2 g 200 mL/hr over 30 Minutes Intravenous On call to O.R. 11/05/21 0733 11/05/21 1010        Patient was given sequential compression devices, early ambulation, and chemoprophylaxis to prevent DVT.  Patient benefited maximally from hospital stay and there were no complications.    Recent vital signs: Patient Vitals for the past 24 hrs:  BP Temp Temp src Pulse Resp SpO2  11/06/21 1406 (!) 127/53 98.4 F (36.9 C) Oral (!) 59 17 95 %     Recent laboratory studies:  Recent Labs    11/06/21 0411  WBC 11.1*  HGB 10.5*  HCT 32.2*  PLT 166   NA 137  K 3.9  CL 105  CO2 27  BUN 21  CREATININE 0.93  GLUCOSE 137*  CALCIUM 8.7*     Discharge Medications:   Allergies as of 11/06/2021   No Known Allergies      Medication List     TAKE these medications    amiodarone 200 MG tablet Commonly known as: PACERONE Take 100 mg by mouth daily.   amLODipine 5 MG tablet Commonly known as: NORVASC Take 5 mg by mouth daily.   Eliquis 5 MG Tabs tablet Generic drug: apixaban Take 5 mg by mouth 2 (two) times daily.   losartan-hydrochlorothiazide 100-12.5 MG tablet Commonly known as: HYZAAR Take 1 tablet by mouth daily.   metoprolol succinate 25 MG 24 hr tablet Commonly known as: TOPROL-XL Take 25 mg by mouth daily.   naproxen 500 MG tablet Commonly known as: NAPROSYN Take 500 mg by mouth 2 (two) times daily as needed (pain).   oxyCODONE 5 MG immediate release tablet Commonly known as: Oxy IR/ROXICODONE Take 1-2 tablets (5-10 mg total) by mouth every 4 (four) hours as needed for moderate pain (pain score 4-6).        Diagnostic Studies: DG Pelvis Portable  Result Date: 11/05/2021 CLINICAL DATA:  Postop left total hip replacement EXAM: PORTABLE PELVIS 1-2 VIEWS COMPARISON:  None Available. FINDINGS: Left upper arthroplasty is in normal alignment without evidence of loosening or  periprosthetic fracture. Normal alignment of the right total hip arthroplasty. Expected soft tissue changes. IMPRESSION: Postoperative changes of left hip arthroplasty. Normal alignment without evidence of immediate hardware complication. Electronically Signed   By: Caprice Renshaw M.D.   On: 11/05/2021 12:48   DG HIP UNILAT WITH PELVIS 1V LEFT  Result Date: 11/05/2021 CLINICAL DATA:  Left hip replacement. EXAM: DG HIP (WITH OR WITHOUT PELVIS) 1V*L* COMPARISON:  06/03/2021 FINDINGS: Five C-arm images of the inferior pelvic region and left hip demonstrate interval placement of a left hip bipolar prosthesis in satisfactory position and alignment. No  fracture or dislocation seen on these images. Stable right hip prosthesis. IMPRESSION: Satisfactory appearance of a left hip prosthesis. Electronically Signed   By: Beckie Salts M.D.   On: 11/05/2021 11:25   DG C-Arm 1-60 Min-No Report  Result Date: 11/05/2021 Fluoroscopy was utilized by the requesting physician.  No radiographic interpretation.    Disposition: Discharge disposition: 01-Home or Self Care          Follow-up Information     Kathryne Hitch, MD Follow up in 2 week(s).   Specialty: Orthopedic Surgery Contact information: [address] [place] [address] [phone]                  Signed: Kathryne Hitch 11/07/2021, 1:24 PM

## 2021-11-08 ENCOUNTER — Encounter (HOSPITAL_COMMUNITY): Payer: Self-pay | Admitting: Orthopaedic Surgery

## 2021-11-09 ENCOUNTER — Telehealth: Payer: Self-pay | Admitting: *Deleted

## 2021-11-09 NOTE — Telephone Encounter (Signed)
Call from HHPT stating patient really needs a 3in1/BSC in the home due to very low toilets. Ordered through [employer] via [employer]. Updated patient that someone from [employer] would contact them regarding shipping or someone coming to retail store to get this.

## 2021-11-18 ENCOUNTER — Encounter: Payer: Self-pay | Admitting: Orthopaedic Surgery

## 2021-11-18 ENCOUNTER — Ambulatory Visit (INDEPENDENT_AMBULATORY_CARE_PROVIDER_SITE_OTHER): Payer: Medicare Other | Admitting: Orthopaedic Surgery

## 2021-11-18 DIAGNOSIS — Z96642 Presence of left artificial hip joint: Secondary | ICD-10-CM

## 2021-11-18 MED ORDER — OXYCODONE HCL 5 MG PO TABS: 5.0000 mg | ORAL_TABLET | Freq: Four times a day (QID) | ORAL | 0 refills | Status: DC | PRN

## 2021-11-18 NOTE — Progress Notes (Signed)
The patient is at his first postoperative visit status post a left total hip arthroplasty.  He is an active 88 year old gentleman.  On exam his incision looks good.  The staples are removed and Steri-Strips applied.  However he does have a very large seroma and I was able to aspirate at least 150 cc of fluid off of this area.  He will increase his activities as he tolerates.  I will send in some more pain medication.  I like to see him back in just 1 week because they will be traveling out of the state soon.  I will likely need to aspirate the seroma again at that visit.

## 2021-11-25 ENCOUNTER — Encounter: Payer: Self-pay | Admitting: Orthopaedic Surgery

## 2021-11-25 ENCOUNTER — Telehealth: Payer: Self-pay | Admitting: Orthopaedic Surgery

## 2021-11-25 ENCOUNTER — Ambulatory Visit (INDEPENDENT_AMBULATORY_CARE_PROVIDER_SITE_OTHER): Payer: Medicare Other | Admitting: Orthopaedic Surgery

## 2021-11-25 DIAGNOSIS — Z96642 Presence of left artificial hip joint: Secondary | ICD-10-CM

## 2021-11-25 MED ORDER — OXYCODONE HCL 5 MG PO TABS: 5.0000 mg | ORAL_TABLET | Freq: Four times a day (QID) | ORAL | 0 refills | Status: DC | PRN

## 2021-11-25 NOTE — Progress Notes (Signed)
The patient is here today at 3 weeks status post a left total hip arthroplasty.  He is 88 years old and traveling to [place] later this evening.  He is on Eliquis and at his first visit last week I drained a large seroma off of his hip.  There is still seroma present but not to the degree.  I still had to aspirate at least 60 cc of fluid off of the hip.  I will send in some more pain medication for him.  At this point he is doing much better so I will see him back in 3 weeks to see how he is doing overall but no x-rays are needed.  He is mobilizing very well today.

## 2021-11-25 NOTE — Telephone Encounter (Signed)
Pharmacy states no auth needed, system was just telling her it was too early to fill

## 2021-11-25 NOTE — Telephone Encounter (Signed)
Pt's relative called needing for to call with pre auth for oxycodone. Pt is going out of town today and need to call as soon as possible. Pt phone is [phone].

## 2022-01-03 ENCOUNTER — Encounter: Payer: Self-pay | Admitting: Physician Assistant

## 2022-01-03 ENCOUNTER — Ambulatory Visit (INDEPENDENT_AMBULATORY_CARE_PROVIDER_SITE_OTHER): Payer: Medicare Other | Admitting: Physician Assistant

## 2022-01-03 DIAGNOSIS — Z96642 Presence of left artificial hip joint: Secondary | ICD-10-CM

## 2022-01-03 MED ORDER — TRAMADOL HCL 50 MG PO TABS: 50.0000 mg | ORAL_TABLET | Freq: Four times a day (QID) | ORAL | 0 refills | Status: AC | PRN

## 2022-01-03 NOTE — Progress Notes (Signed)
HPI: Mr. Dudding returns today 7 weeks 3 days status post left total hip arthroplasty.  He is overall doing well.  He still has pain 8 out of 10 at worst.  But he feels that he is improving.  He is on chronic Eliquis.  He has been taking some naproxen when he has hip pain.  He states that he has ran out of pain medication at this point time.  He states that his surgical incisions healed well.   Physical exam: General well-developed well-nourished male in no acute distress ambulates with a cane. Left hip good range of motion without pain.  Left calf supple nontender dorsiflexion plantarflexion left ankle intact.  Impression: Status post left total hip arthroplasty 11/05/2021.  Plan: Advised him not to take naproxen while on Eliquis.  We will send in some tramadol for his pain.  He will follow-up with [place] in 4 months at the 34-month visit and at that point time we will obtain an AP pelvis lateral view of his left hip.  He will follow-up with [place] sooner if there is any questions or concerns.

## 2022-08-25 ENCOUNTER — Ambulatory Visit: Payer: Medicare Other | Admitting: Physician Assistant

## 2022-09-07 ENCOUNTER — Ambulatory Visit: Payer: Medicare Other | Admitting: Orthopaedic Surgery

## 2022-09-20 ENCOUNTER — Other Ambulatory Visit (INDEPENDENT_AMBULATORY_CARE_PROVIDER_SITE_OTHER): Payer: 59

## 2022-09-20 ENCOUNTER — Other Ambulatory Visit: Payer: Self-pay

## 2022-09-20 ENCOUNTER — Ambulatory Visit (INDEPENDENT_AMBULATORY_CARE_PROVIDER_SITE_OTHER): Payer: 59 | Admitting: Physician Assistant

## 2022-09-20 DIAGNOSIS — M7061 Trochanteric bursitis, right hip: Secondary | ICD-10-CM

## 2022-09-20 DIAGNOSIS — M79605 Pain in left leg: Secondary | ICD-10-CM | POA: Diagnosis not present

## 2022-09-20 DIAGNOSIS — M7062 Trochanteric bursitis, left hip: Secondary | ICD-10-CM | POA: Diagnosis not present

## 2022-09-20 NOTE — Progress Notes (Signed)
HPI: Mr. Frymier comes in today complaining some back pain.  He is also had some left hip soreness.  History of left total hip arthroplasty 11/05/2021.  However he states that the pain is now the lateral aspect of both hips comes and goes but is better now.  He notes that this started about 2 weeks ago.  He has been taking presumably oxycodone that he had leftover.  He denies any radicular symptoms down either leg.  No groin pain  Review of systems: See HPI Otherwise negative  Physical exam: General well-developed well-nourished male no acute distress ambulates with nonantalgic gait. Bilateral hips good range of motion without pain.  Tenderness over the trochanteric region both hips.   Lower extremities: Negative straight leg raise bilaterally.  5 out of 5 strength throughout lower extremities against resistance.  Radiographs:AP pelvis lateral view of the left hip: No acute fractures.  Status post bilateral total hip arthroplasties well-seated components.  Bilateral hips well located.  No acute findings. Lumbar spine 2 views: Loss of lordotic curvature.  No spondylolisthesis.  Degenerative changes of the particular the lower lumbar spine with anterior endplate spurring and slight loss of disc space.  No acute fractures or acute findings.  Impression: Left hip trochanteric bursitis Right hip trochanteric bursitis.  Plan: Offered cortisone injections for the trochanteric bursitis he defers.  Therefore he shown IT band stretching exercises.  He will follow-up with [place] as needed pain persist or becomes worse.  Questions were encouraged and answered at length.

## 2022-11-07 ENCOUNTER — Other Ambulatory Visit (INDEPENDENT_AMBULATORY_CARE_PROVIDER_SITE_OTHER): Payer: 59

## 2022-11-07 ENCOUNTER — Ambulatory Visit: Payer: 59 | Admitting: Orthopaedic Surgery

## 2022-11-07 DIAGNOSIS — M545 Low back pain, unspecified: Secondary | ICD-10-CM | POA: Diagnosis not present

## 2022-11-07 DIAGNOSIS — G8929 Other chronic pain: Secondary | ICD-10-CM | POA: Diagnosis not present

## 2022-11-07 MED ORDER — METHYLPREDNISOLONE 4 MG PO TABS: ORAL_TABLET | ORAL | 0 refills | Status: DC

## 2022-11-07 MED ORDER — TIZANIDINE HCL 2 MG PO TABS: 2.0000 mg | ORAL_TABLET | Freq: Three times a day (TID) | ORAL | 1 refills | Status: DC | PRN

## 2022-11-07 NOTE — Progress Notes (Signed)
The patient is an 89 year old gentleman who is very active and well-known to [place].  He would like to get back into fishing.  We have replaced both of his hips with the last 1 being a year ago.  He does ambulate with a walking stick.  He comes in with a chief complaint of low back pain that is chronic.  He denies any radicular symptoms.  He does have painful shoulders as well.  Both shoulders move smoothly and fluidly with just a little bit of pain.  He shows no significant deficit of the rotator cuff fortunately on either side.  Of note he is on Eliquis so he cannot take anti-inflammatories.  His lumbar spine has significant limitations with flexion and extension.  Extension causes most of his pain.  A lot of his pain seems to be facet joint mediated.  2 views of the lumbar spine show loss of his lumbar lordosis and degenerative changes at several levels.  I offered him physical therapy but he has deferred this.  We will try a steroid taper as well as Zanaflex to see if this will help him and we will do a low-dose of Zanaflex at 2 mg.  My next step would be having him consider outpatient physical therapy if conservative treatment does not work for him.  He knows to let us know.  Follow-up otherwise is as needed.

## 2022-12-07 ENCOUNTER — Ambulatory Visit (INDEPENDENT_AMBULATORY_CARE_PROVIDER_SITE_OTHER): Payer: 59 | Admitting: Orthopaedic Surgery

## 2022-12-07 DIAGNOSIS — M7062 Trochanteric bursitis, left hip: Secondary | ICD-10-CM

## 2022-12-07 MED ORDER — LIDOCAINE HCL 1 % IJ SOLN
3.0000 mL | INTRAMUSCULAR | Status: AC | PRN
  Administered 2022-12-07: 3 mL

## 2022-12-07 MED ORDER — METHYLPREDNISOLONE ACETATE 40 MG/ML IJ SUSP
40.0000 mg | INTRAMUSCULAR | Status: AC | PRN
  Administered 2022-12-07: 40 mg via INTRA_ARTICULAR

## 2022-12-07 NOTE — Progress Notes (Signed)
The patient is an 89 year old gentleman well-known to [place].  We have replaced both of his hips but the second one being the left hip in August of last year.  He has been having some pain over the lateral aspect of his left hip.  I have offered him injection before but he has deferred this.  He does ambulate with a cane.  He comes in today requesting a steroid injection over the trochanteric area of that hip.  He denies any recent illnesses.  He says the hip joint itself is doing well.  On exam his left hip moves smoothly and fluidly.  There is only pain over the trochanteric area and the proximal IT band.  Per his request I did place a steroid injection in this area which she tolerated well.  Follow-up can be as needed     Procedure Note  Patient: Jeffery White             Date of Birth: [birth year]           MRN: [id number]             Visit Date: 12/07/2022  Procedures: Visit Diagnoses:  1. Trochanteric bursitis, left hip     Large Joint Inj: L greater trochanter on 12/07/2022 3:42 PM Indications: pain and diagnostic evaluation Details: 22 G 1.5 in needle, lateral approach  Arthrogram: No  Medications: 3 mL lidocaine 1 %; 40 mg methylPREDNISolone acetate 40 MG/ML Outcome: tolerated well, no immediate complications Procedure, treatment alternatives, risks and benefits explained, specific risks discussed. Consent was given by the patient. Immediately prior to procedure a time out was called to verify the correct patient, procedure, equipment, support staff and site/side marked as required. Patient was prepped and draped in the usual sterile fashion.

## 2023-04-03 ENCOUNTER — Ambulatory Visit: Payer: 59 | Admitting: Physician Assistant

## 2023-04-13 ENCOUNTER — Ambulatory Visit: Payer: 59 | Admitting: Physician Assistant

## 2023-04-13 ENCOUNTER — Other Ambulatory Visit (INDEPENDENT_AMBULATORY_CARE_PROVIDER_SITE_OTHER): Payer: 59

## 2023-04-13 DIAGNOSIS — M25561 Pain in right knee: Secondary | ICD-10-CM | POA: Diagnosis not present

## 2023-04-13 DIAGNOSIS — M25511 Pain in right shoulder: Secondary | ICD-10-CM

## 2023-04-13 NOTE — Progress Notes (Signed)
Office Visit Note   Patient: Jeffery White           Date of Birth: 1933/12/05           MRN: [id number] Visit Date: 04/13/2023              Requested by: Rinaldo Cloud, MD [phone] [address] [place],  [place] [address] PCP: Rinaldo Cloud, MD   Assessment & Plan: Visit Diagnoses:  1. Right knee pain, unspecified chronicity   2. Acute pain of right shoulder     Plan: Given the fact that he is having no knee pain would not recommend any treatment.  In regards to his right shoulder right shoulder wall crawls and forward flexion exercises.  Pain persist or becomes worse he will follow-up with [place] otherwise follow-up as needed.  Follow-Up Instructions: Return if symptoms worsen or fail to improve.   Orders:  Orders Placed This Encounter  Procedures   XR Knee 1-2 Views Right   XR Shoulder Right   No orders of the defined types were placed in this encounter.     Procedures: No procedures performed   Clinical Data: No additional findings.   Subjective: Chief Complaint  Patient presents with   Right Shoulder - Pain   Right Knee - Pain    HPI Jeffery White 89 year old male well-known to Dr. Raye Sorrow service comes in today due to right shoulder pain and right knee swelling.  He states his knee is not particularly painful.  The move in any way while seated is noted in the shower that it was swollen.  He denies any mechanical symptoms.  He has had no new injury to the knee.  Right shoulder pain hurts to raise above his head has been ongoing for the last month no known injury.  Does not awaken him.  Denies any radicular symptoms down the arm.  He is nondiabetic.  Review of Systems No fevers or chills  Objective: Vital Signs: There were no vitals taken for this visit.  Physical Exam Constitutional:      Appearance: He is not ill-appearing or diaphoretic.  Pulmonary:     Effort: Pulmonary effort is normal.  Neurological:     Mental Status: He is alert and oriented  to person, place, and time.  Psychiatric:        Mood and Affect: Mood normal.     Ortho Exam Right knee good range of motion without pain nontender over the medial lateral joint line.  No instability valgus varus stressing.  No abnormal warmth erythema or effusion.  Bilateral shoulders: 5 out of 5 strength with external and internal rotation against resistance.  Empty can test is negative bilaterally.  Liftoff test negative bilaterally.  Overhead range of motion left shoulder full.  Lacks last few degrees with the right shoulder actively.   Specialty Comments:  No specialty comments available.  Imaging: XR Knee 1-2 Views Right Result Date: 04/13/2023 Right knee 2 views: Tricompartmental arthritis with near bone-on-bone medial lateral compartment.  Severe patellofemoral arthritic changes.  No acute fractures or acute findings.  Knee is well located.  XR Shoulder Right Result Date: 04/13/2023 Right shoulder 3 views: Shoulder is well located.  No acute fractures.  Glenohumeral joint is well-preserved.  Mild arthritic changes about humeral head.    PMFS History: Patient Active Problem List   Diagnosis Date Noted   Status post total replacement of left hip 11/05/2021   Unilateral primary osteoarthritis, left hip 06/21/2021  Status post total replacement of right hip 04/14/2017   Unilateral primary osteoarthritis, right hip 03/06/2017   Past Medical History:  Diagnosis Date   Arthritis    Atrial flutter (HCC)    Chronic back pain    Gout    Heart murmur    Hypercholesteremia    Hypertension     No family history on file.  Past Surgical History:  Procedure Laterality Date   COLONOSCOPY     x2   TOTAL HIP ARTHROPLASTY Right 04/14/2017   Procedure: RIGHT TOTAL HIP ARTHROPLASTY ANTERIOR APPROACH;  Surgeon: Kathryne Hitch, MD;  Location: [facility];  Service: Orthopedics;  Laterality: Right;   TOTAL HIP ARTHROPLASTY Left 11/05/2021   Procedure: LEFT TOTAL HIP ARTHROPLASTY  ANTERIOR APPROACH;  Surgeon: Kathryne Hitch, MD;  Location: [facility];  Service: Orthopedics;  Laterality: Left;   Social History   Occupational History   Not on file  Tobacco Use   Smoking status: Never   Smokeless tobacco: Never  Vaping Use   Vaping status: Never Used  Substance and Sexual Activity   Alcohol use: No   Drug use: No   Sexual activity: Not on file

## 2023-05-30 ENCOUNTER — Other Ambulatory Visit (INDEPENDENT_AMBULATORY_CARE_PROVIDER_SITE_OTHER)

## 2023-05-30 ENCOUNTER — Ambulatory Visit (INDEPENDENT_AMBULATORY_CARE_PROVIDER_SITE_OTHER): Payer: 59 | Admitting: Physician Assistant

## 2023-05-30 ENCOUNTER — Encounter: Payer: Self-pay | Admitting: Physician Assistant

## 2023-05-30 DIAGNOSIS — M5441 Lumbago with sciatica, right side: Secondary | ICD-10-CM | POA: Diagnosis not present

## 2023-05-30 DIAGNOSIS — M5442 Lumbago with sciatica, left side: Secondary | ICD-10-CM | POA: Diagnosis not present

## 2023-05-30 DIAGNOSIS — M25511 Pain in right shoulder: Secondary | ICD-10-CM

## 2023-05-30 DIAGNOSIS — G8929 Other chronic pain: Secondary | ICD-10-CM

## 2023-05-30 MED ORDER — TIZANIDINE HCL 2 MG PO TABS
2.0000 mg | ORAL_TABLET | Freq: Three times a day (TID) | ORAL | 1 refills | Status: DC | PRN
Start: 2023-05-30 — End: 2023-08-23

## 2023-05-30 MED ORDER — METHYLPREDNISOLONE 4 MG PO TABS: ORAL_TABLET | ORAL | 0 refills | Status: AC

## 2023-05-30 NOTE — Progress Notes (Signed)
 Office Visit Note   Patient: Jeffery White           Date of Birth: [birth year]           MRN: [id number] Visit Date: 05/30/2023              Requested by: Rinaldo Cloud, MD [phone] [address] [place],  [place] [address] PCP: Rinaldo Cloud, MD   Assessment & Plan: Visit Diagnoses:  1. Chronic bilateral low back pain with bilateral sciatica     Plan: We will send him to formal physical therapy for back exercises, core strengthening, home exercise program, and modalities.  He is placed on prednisone and Zanaflex.  Will see him back in 4 weeks see how he is doing overall.  Questions were encouraged and answered at length.  Follow-Up Instructions: No follow-ups on file.   Orders:  Orders Placed This Encounter  Procedures   XR Lumbar Spine 2-3 Views   No orders of the defined types were placed in this encounter.     Procedures: No procedures performed   Clinical Data: No additional findings.   Subjective: Chief Complaint  Patient presents with   Right Shoulder - Pain   Lower Back - Pain    HPI [name] returns today he states that his right shoulder status post intra-articular injection by Dr. Shon Baton is doing well.  He today has new complaint of low back pain.  He has had low back pain in the past.  States pain comes and goes.  This newest episode is causing mainly pain in his left leg.  Denies any numbness.  Ranks his back pain is to be 7-8 out of 10 pain worse with movements.  No waking pain.  No bowel or bladder dysfunction or saddle anesthesia.  He has had no new injury.  No fevers or chills.  Review of Systems  Constitutional:  Negative for chills and fever.     Objective: Vital Signs: There were no vitals taken for this visit.  Physical Exam Constitutional:      Appearance: He is normal weight. He is not ill-appearing or diaphoretic.  Cardiovascular:     Pulses: Normal pulses.  Pulmonary:     Effort: Pulmonary effort is normal.   Neurological:     Mental Status: He is alert and oriented to person, place, and time.  Psychiatric:        Mood and Affect: Mood normal.      Ortho Exam Lumbar spine he has tenderness in the left lower paraspinous region.  5 out of 5 strength throughout the lower extremities against resistance negative straight leg raise bilaterally.  Dorsal pedal pulses are 2+ and equal and symmetric.  Limited extension of the lumbar spine full flexion.  Sensation grossly intact to light touch bilateral feet.  Specialty Comments:  No specialty comments available.  Imaging: XR Lumbar Spine 2-3 Views Result Date: 05/30/2023 Lumbar spine 2 views: No acute fractures.  No spondylolisthesis.  Loss of lordotic curvature.  Lower lumbar facet arthritic changes.  Multilevel endplate spurring.  Disc base overall well-maintained    PMFS History: Patient Active Problem List   Diagnosis Date Noted   Status post total replacement of left hip 11/05/2021   Unilateral primary osteoarthritis, left hip 06/21/2021   Status post total replacement of right hip 04/14/2017   Unilateral primary osteoarthritis, right hip 03/06/2017   Past Medical History:  Diagnosis Date   Arthritis    Atrial flutter (HCC)  Chronic back pain    Gout    Heart murmur    Hypercholesteremia    Hypertension     No family history on file.  Past Surgical History:  Procedure Laterality Date   COLONOSCOPY     x2   TOTAL HIP ARTHROPLASTY Right 04/14/2017   Procedure: RIGHT TOTAL HIP ARTHROPLASTY ANTERIOR APPROACH;  Surgeon: Kathryne Hitch, MD;  Location: [facility];  Service: Orthopedics;  Laterality: Right;   TOTAL HIP ARTHROPLASTY Left 11/05/2021   Procedure: LEFT TOTAL HIP ARTHROPLASTY ANTERIOR APPROACH;  Surgeon: Kathryne Hitch, MD;  Location: [facility];  Service: Orthopedics;  Laterality: Left;   Social History   Occupational History   Not on file  Tobacco Use   Smoking status: Never   Smokeless tobacco: Never   Vaping Use   Vaping status: Never Used  Substance and Sexual Activity   Alcohol use: No   Drug use: No   Sexual activity: Not on file

## 2023-05-31 ENCOUNTER — Other Ambulatory Visit: Payer: Self-pay

## 2023-05-31 DIAGNOSIS — G8929 Other chronic pain: Secondary | ICD-10-CM

## 2023-07-17 ENCOUNTER — Ambulatory Visit: Admitting: Physician Assistant

## 2023-08-23 ENCOUNTER — Ambulatory Visit (INDEPENDENT_AMBULATORY_CARE_PROVIDER_SITE_OTHER): Admitting: Physician Assistant

## 2023-08-23 DIAGNOSIS — M65351 Trigger finger, right little finger: Secondary | ICD-10-CM | POA: Diagnosis not present

## 2023-08-23 DIAGNOSIS — M25511 Pain in right shoulder: Secondary | ICD-10-CM | POA: Diagnosis not present

## 2023-08-23 MED ORDER — METHYLPREDNISOLONE ACETATE 40 MG/ML IJ SUSP
20.0000 mg | INTRAMUSCULAR | Status: AC | PRN
  Administered 2023-08-23: 20 mg

## 2023-08-23 MED ORDER — LIDOCAINE HCL 1 % IJ SOLN
0.5000 mL | INTRAMUSCULAR | Status: AC | PRN
  Administered 2023-08-23: .5 mL

## 2023-08-23 MED ORDER — TIZANIDINE HCL 2 MG PO TABS: 2.0000 mg | ORAL_TABLET | Freq: Three times a day (TID) | ORAL | 1 refills | Status: AC | PRN

## 2023-08-23 NOTE — Progress Notes (Signed)
 HPI: Jeffery White returns today multiple complaints.  He states his right shoulder is better since undergoing intra-articular injection by Dr. Vaughn Georges.  Has minimal pain but better movement.  Doing well except for raising the head.  No new injury.  When asked about his shoulder again he states he is overall doing well.  Right knee pain for 2 weeks but presents is not bothering him.  No injury to the hip.  Main complaint today is right fifth finger which is triggering.  He is getting stuck in the stepdown whenever he awakens.  He has had no injury finger.  Review of systems: Denies any fevers chills.  Physical exam: General Well-developed well-nourished male no acute distress mood affect appropriate. Psych: Alert and oriented x 3 Bilateral hands good range of motion all fingers except for the right fifth finger which is actively triggering.  He has palpable nodule at the A1 pulley that is tender to touch.  No rashes skin lesions ulcerations otherwise throughout both hands.    Procedure Note  Patient: Jeffery White             Date of Birth: Jul 13, 1933           MRN: [id number]             Visit Date: 08/23/2023  Procedures: Visit Diagnoses:  1. Trigger little finger of right hand   2. Acute pain of right shoulder     Hand/UE Inj: R small A1 for trigger finger on 08/23/2023 12:39 PM Medications: 0.5 mL lidocaine  1 %; 20 mg methylPREDNISolone  acetate 40 MG/ML Consent was given by the patient. Immediately prior to procedure a time out was called to verify the correct patient, procedure, equipment, support staff and site/side marked as required. Patient was prepped and draped in the usual sterile fashion.          Impression: Right fifth ring finger trigger finger Right shoulder pain  Plan: Offered him cortisone injection he was agreeable.  He will follow-up with us  as needed.  Also recommended shoulder physical therapy he was agreeable.  This to be for range of motion strengthening.   Questions were encouraged and answered at length.
